# Patient Record
Sex: Male | Born: 1975 | Race: White | Hispanic: No | Marital: Married | Smoking: Former smoker
Health system: Southern US, Community
[De-identification: ages and names within clinical notes are randomized; demographics above are authoritative.]

## PROBLEM LIST (undated history)

## (undated) DIAGNOSIS — M1990 Unspecified osteoarthritis, unspecified site: Secondary | ICD-10-CM

## (undated) DIAGNOSIS — I499 Cardiac arrhythmia, unspecified: Secondary | ICD-10-CM

## (undated) DIAGNOSIS — I2510 Atherosclerotic heart disease of native coronary artery without angina pectoris: Secondary | ICD-10-CM

## (undated) DIAGNOSIS — R51 Headache: Secondary | ICD-10-CM

## (undated) DIAGNOSIS — E785 Hyperlipidemia, unspecified: Secondary | ICD-10-CM

## (undated) DIAGNOSIS — I10 Essential (primary) hypertension: Secondary | ICD-10-CM

## (undated) DIAGNOSIS — R519 Headache, unspecified: Secondary | ICD-10-CM

## (undated) DIAGNOSIS — F419 Anxiety disorder, unspecified: Secondary | ICD-10-CM

## (undated) DIAGNOSIS — F32A Depression, unspecified: Secondary | ICD-10-CM

## (undated) HISTORY — PX: JOINT REPLACEMENT: SHX530

## (undated) HISTORY — PX: MOUTH SURGERY: SHX715

---

## 2005-04-22 ENCOUNTER — Emergency Department: Payer: Self-pay | Admitting: Emergency Medicine

## 2005-04-22 ENCOUNTER — Other Ambulatory Visit: Payer: Self-pay

## 2005-12-07 ENCOUNTER — Emergency Department: Payer: Self-pay | Admitting: Unknown Physician Specialty

## 2005-12-07 ENCOUNTER — Other Ambulatory Visit: Payer: Self-pay

## 2010-02-17 ENCOUNTER — Emergency Department: Payer: Self-pay | Admitting: Emergency Medicine

## 2015-11-19 ENCOUNTER — Encounter: Payer: Self-pay | Admitting: Physician Assistant

## 2015-11-19 ENCOUNTER — Ambulatory Visit: Payer: Self-pay | Admitting: Physician Assistant

## 2015-11-19 VITALS — BP 140/90 | HR 93 | Temp 97.8°F

## 2015-11-19 DIAGNOSIS — G44209 Tension-type headache, unspecified, not intractable: Secondary | ICD-10-CM

## 2015-11-19 DIAGNOSIS — J029 Acute pharyngitis, unspecified: Secondary | ICD-10-CM

## 2015-11-19 LAB — POCT RAPID STREP A (OFFICE): Rapid Strep A Screen: NEGATIVE

## 2015-11-19 MED ORDER — CYCLOBENZAPRINE HCL 10 MG PO TABS: 10.0000 mg | ORAL_TABLET | Freq: Three times a day (TID) | ORAL | Status: DC | PRN

## 2015-11-19 NOTE — Progress Notes (Signed)
S: c/o headache since yesterday, pain at sides of head, no fever/chills, no known injury, no photophobia or sensitivity to noise, did get a little nauseated due to pain, no blurred vision, no loss of speech, etc, no weakness, states pain is better than when he made appointment  O: vitals wnl, bp at 140/90; normocephalic, perrl eomi, tms clear, throat red, neck supple no lymph, lungs c t a, cv rrr, cspine nontender, trap muscles spasmed b/l, grips = b/l, cn II-XII grossly intact, q-strep neg  A: tension headache  P: flexeril otc tylenol or motrin, if headache worsening go to ER for ct scan

## 2015-11-19 NOTE — Patient Instructions (Signed)
Tension Headache A tension headache is pain, pressure, or aching that is felt over the front and sides of your head. These headaches can last from 30 minutes to several days. HOME CARE Managing Pain  Take over-the-counter and prescription medicines only as told by your doctor.  Lie down in a dark, quiet room when you have a headache.  If directed, apply ice to your head and neck area:  Put ice in a plastic bag.  Place a towel between your skin and the bag.  Leave the ice on for 20 minutes, 2-3 times per day.  Use a heating pad or a hot shower to apply heat to your head and neck area as told by your doctor. Eating and Drinking  Eat meals on a regular schedule.  Do not drink a lot of alcohol.  Do not use a lot of caffeine, or stop using caffeine. General Instructions  Keep all follow-up visits as told by your doctor. This is important.  Keep a journal to find out if certain things bring on headaches. For example, write down:  What you eat and drink.  How much sleep you get.  Any change to your diet or medicines.  Try getting a massage, or doing other things that help you to relax.  Lessen stress.  Sit up straight. Do not tighten (tense) your muscles.  Do not use tobacco products. This includes cigarettes, chewing tobacco, or e-cigarettes. If you need help quitting, ask your doctor.  Exercise regularly as told by your doctor.  Get enough sleep. This may mean 7-9 hours of sleep. GET HELP IF:  Your symptoms are not helped by medicine.  You have a headache that feels different from your usual headache.  You feel sick to your stomach (nauseous) or you throw up (vomit).  You have a fever. GET HELP RIGHT AWAY IF:  Your headache becomes very bad.  You keep throwing up.  You have a stiff neck.  You have trouble seeing.  You have trouble speaking.  You have pain in your eye or ear.  Your muscles are weak or you lose muscle control.  You lose your balance  or you have trouble walking.  You feel like you will pass out (faint) or you pass out.  You have confusion.   This information is not intended to replace advice given to you by your health care provider. Make sure you discuss any questions you have with your health care provider.   Document Released: 02/07/2010 Document Revised: 08/04/2015 Document Reviewed: 03/08/2015 Elsevier Interactive Patient Education 2016 [employer] General Headache Without Cause A headache is pain or discomfort felt around the head or neck area. There are many causes and types of headaches. In some cases, the cause may not be found.  HOME CARE  Managing Pain  Take over-the-counter and prescription medicines only as told by your doctor.  Lie down in a dark, quiet room when you have a headache.  If directed, apply ice to the head and neck area:  Put ice in a plastic bag.  Place a towel between your skin and the bag.  Leave the ice on for 20 minutes, 2-3 times per day.  Use a heating pad or hot shower to apply heat to the head and neck area as told by your doctor.  Keep lights dim if bright lights bother you or make your headaches worse. Eating and Drinking  Eat meals on a regular schedule.  Lessen how much alcohol you drink.  Lessen how much caffeine you drink, or stop drinking caffeine. General Instructions  Keep all follow-up visits as told by your doctor. This is important.  Keep a journal to find out if certain things bring on headaches. For example, write down:  What you eat and drink.  How much sleep you get.  Any change to your diet or medicines.  Relax by getting a massage or doing other relaxing activities.  Lessen stress.  Sit up straight. Do not tighten (tense) your muscles.  Do not use tobacco products. This includes cigarettes, chewing tobacco, or e-cigarettes. If you need help quitting, ask your doctor.  Exercise regularly as told by your doctor.  Get enough sleep.  This often means 7-9 hours of sleep. GET HELP IF:  Your symptoms are not helped by medicine.  You have a headache that feels different than the other headaches.  You feel sick to your stomach (nauseous) or you throw up (vomit).  You have a fever. GET HELP RIGHT AWAY IF:   Your headache becomes really bad.  You keep throwing up.  You have a stiff neck.  You have trouble seeing.  You have trouble speaking.  You have pain in the eye or ear.  Your muscles are weak or you lose muscle control.  You lose your balance or have trouble walking.  You feel like you will pass out (faint) or you pass out.  You have confusion.   This information is not intended to replace advice given to you by your health care provider. Make sure you discuss any questions you have with your health care provider.   Document Released: 08/22/2008 Document Revised: 08/04/2015 Document Reviewed: 03/08/2015 Elsevier Interactive Patient Education [employer]

## 2015-11-30 ENCOUNTER — Ambulatory Visit: Payer: Self-pay | Admitting: Physician Assistant

## 2015-12-06 ENCOUNTER — Encounter: Payer: Self-pay | Admitting: Physician Assistant

## 2015-12-06 ENCOUNTER — Ambulatory Visit: Payer: Self-pay | Admitting: Physician Assistant

## 2015-12-06 VITALS — BP 120/70 | HR 114 | Temp 98.5°F

## 2015-12-06 DIAGNOSIS — R519 Headache, unspecified: Secondary | ICD-10-CM

## 2015-12-06 DIAGNOSIS — R51 Headache: Principal | ICD-10-CM

## 2015-12-06 MED ORDER — BUTALBITAL-APAP-CAFFEINE 50-300-40 MG PO CAPS: 1.0000 | ORAL_CAPSULE | Freq: Four times a day (QID) | ORAL | Status: DC | PRN

## 2015-12-06 NOTE — Progress Notes (Signed)
S: continued headaches, concerned as had a really bad headache and [place], had ct scan which was neg, was given fluids and steroid through iv, headache went away, states can't really associate anything with it other than it tends to happen more in the afternoons, sometimes after eating, no hx food allergies, no v from headache, no aura, never had headaches like this before  O: vitals wnl, nad, perrl eomi, cn II-XII grossly intact, lungs c t a, cv rrr  A: migraine  P: fioricet, mri brain with and without contrast, refer to neurology

## 2015-12-07 ENCOUNTER — Emergency Department: Payer: 59

## 2015-12-07 ENCOUNTER — Emergency Department
Admission: EM | Admit: 2015-12-07 | Discharge: 2015-12-07 | Disposition: A | Discharge status: Home / Self Care | Payer: 59 | Attending: Emergency Medicine | Admitting: Emergency Medicine

## 2015-12-07 DIAGNOSIS — I10 Essential (primary) hypertension: Secondary | ICD-10-CM | POA: Diagnosis not present

## 2015-12-07 DIAGNOSIS — R51 Headache: Secondary | ICD-10-CM | POA: Insufficient documentation

## 2015-12-07 DIAGNOSIS — R519 Headache, unspecified: Secondary | ICD-10-CM

## 2015-12-07 DIAGNOSIS — Z87891 Personal history of nicotine dependence: Secondary | ICD-10-CM | POA: Diagnosis not present

## 2015-12-07 HISTORY — DX: Essential (primary) hypertension: I10

## 2015-12-07 LAB — BASIC METABOLIC PANEL
Anion gap: 13 (ref 5–15)
BUN: 12 mg/dL (ref 6–20)
CHLORIDE: 101 mmol/L (ref 101–111)
CO2: 23 mmol/L (ref 22–32)
CREATININE: 0.79 mg/dL (ref 0.61–1.24)
Calcium: 10.3 mg/dL (ref 8.9–10.3)
GFR calc non Af Amer: >60 mL/min (ref >60)
GLUCOSE: 87 mg/dL (ref 65–99)
Potassium: 3.5 mmol/L (ref 3.5–5.1)
Sodium: 137 mmol/L (ref 135–145)

## 2015-12-07 LAB — CBC WITH DIFFERENTIAL/PLATELET
BASOS PCT: 1 %
Basophils Absolute: 0 10*3/uL (ref 0–0.1)
EOS ABS: 0.2 10*3/uL (ref 0–0.7)
EOS PCT: 2 %
HEMATOCRIT: 43.2 % (ref 40.0–52.0)
HEMOGLOBIN: 15 g/dL (ref 13.0–18.0)
Lymphocytes Relative: 19 %
Lymphs Abs: 2.1 10*3/uL (ref 1.0–3.6)
MCH: 32.1 pg (ref 26.0–34.0)
MCHC: 34.7 g/dL (ref 32.0–36.0)
MCV: 92.5 fL (ref 80.0–100.0)
Monocytes Absolute: 0.7 10*3/uL (ref 0.2–1.0)
Monocytes Relative: 7 %
Neutro Abs: 7.8 10*3/uL — ABNORMAL HIGH (ref 1.4–6.5)
Neutrophils Relative %: 71 %
Platelets: 298 10*3/uL (ref 150–440)
RBC: 4.67 MIL/uL (ref 4.40–5.90)
RDW: 13.2 % (ref 11.5–14.5)
WBC: 10.8 10*3/uL — AB (ref 3.8–10.6)

## 2015-12-07 MED ORDER — DIPHENHYDRAMINE HCL 50 MG/ML IJ SOLN
12.5000 mg | Freq: Once | INTRAMUSCULAR | Status: AC
  Administered 2015-12-07: 12.5 mg via INTRAVENOUS
  Filled 2015-12-07: qty 1

## 2015-12-07 MED ORDER — METOCLOPRAMIDE HCL 5 MG/ML IJ SOLN
10.0000 mg | Freq: Once | INTRAMUSCULAR | Status: AC
  Administered 2015-12-07: 10 mg via INTRAVENOUS
  Filled 2015-12-07: qty 2

## 2015-12-07 MED ORDER — SODIUM CHLORIDE 0.9 % IV BOLUS (SEPSIS)
1000.0000 mL | Freq: Once | INTRAVENOUS | Status: AC
  Administered 2015-12-07: 1000 mL via INTRAVENOUS

## 2015-12-07 NOTE — ED Notes (Signed)
Pt with recent hx of HA. Has scheduled MRI end of month. Pain was 10/10 when arrived, 6/10 now.

## 2015-12-07 NOTE — ED Provider Notes (Addendum)
[facility] Emergency Department Provider Note  ____________________________________________   I have reviewed the triage vital signs and the nursing notes.   HISTORY  Chief Complaint Headache    HPI Dennis Davidson is a 40 y.o. male with a history of headaches for the last 3-4 weeks. He has not had headaches significant only prior to that. The headaches come and go gradually. The initial one seemed to be more precipitous in onset this is a timely been gradual. He has had no carbon monoxide exposure. It happened on vacation. No family members with similar. No vomiting no photophobia and no neurologic complaint of stiff neck, no recent change in his caffeine he has tried drinking caffeine and not drink caffeine in it does not affect it. Seems to be sometimes related to food. He has had no abdominal pain shortness of breath or cough. He denies numbness or weakness or difficulty talking or seeing, he has no neurologic complaints. The patient works as a Emergency planning/management officer. He denies any head trauma or history of head traumas. He denies any personal or family history of aneurysm. He has had a negative CT scan for this. He is scheduled to see neurology. He has an outpatient MRI scheduled in a few weeks. The patient has had a history of neck pain in the past but this headache is more diffuse and more frontal. There is no aura. He denies any significant headache history in his family. He denies any change in environmental situation that might cause his headaches. He states today the headache gradually worse. He was given Fioricet for this as an outpatient when he went to the hospital before Christmas, and he took 2 of them. His headache is now diminished; it is a 5 or 6 out of 10 at this time.    Past Medical History  Diagnosis Date  . Hypertension     There are no active problems to display for this patient.   History reviewed. No pertinent past surgical history.  No current  outpatient prescriptions on file.  Allergies Review of patient's allergies indicates no known allergies.  No family history on file.  Social History Social History  Substance Use Topics  . Smoking status: Former Games developer  . Smokeless tobacco: None  . Alcohol Use: No    Review of Systems }Constitutional: No fever/chills Eyes: No visual changes. ENT: No sore throat. No stiff neck no neck pain Cardiovascular: Denies chest pain. Respiratory: Denies shortness of breath. Gastrointestinal:   no vomiting.  No diarrhea.  No constipation. Genitourinary: Negative for dysuria. Musculoskeletal: Negative lower extremity swelling Skin: Negative for rash. Neurological: Negative for headaches, focal weakness or numbness. 10-point ROS otherwise negative.  ____________________________________________   PHYSICAL EXAM:  VITAL SIGNS: ED Triage Vitals  Enc Vitals Group     BP 12/07/15 1412 174/102 mmHg     Pulse Rate 12/07/15 1412 99     Resp 12/07/15 1412 18     Temp 12/07/15 1412 97.7 F (36.5 C)     Temp Source 12/07/15 1412 Oral     SpO2 12/07/15 1412 100 %     Weight 12/07/15 1413 175 lb (79.379 kg)     Height 12/07/15 1413 5\' 8"  (1.727 m)     Head Cir --      Peak Flow --      Pain Score 12/07/15 1413 10     Pain Loc --      Pain Edu? --      Excl.  in GC? --     Constitutional: Alert and oriented. Well appearing and in no acute distress. Eyes: Conjunctivae are normal. PERRL. EOMI. Head: Atraumatic. Nose: No congestion/rhinnorhea. Mouth/Throat: Mucous membranes are moist.  Oropharynx non-erythematous. Neck: No stridor.   Nontender with no meningismus Cardiovascular: Normal rate, regular rhythm. Grossly normal heart sounds.  Good peripheral circulation. Respiratory: Normal respiratory effort.  No retractions. Lungs CTAB. Abdominal: Soft and nontender. No distention. No guarding no rebound Back:  There is no focal tenderness or step off there is no midline tenderness there are  no lesions noted. there is no CVA tenderness Musculoskeletal: No lower extremity tenderness. No joint effusions, no DVT signs strong distal pulses no edema Cranial nerves II through XII are grossly intact 5 out of 5 strength bilateral upper and lower extremity. Finger to nose within normal limits heel to shin within normal limits, speech is normal with no word finding difficulty or dysarthria, reflexes symmetric, pupils are equally round and reactive to light, there is no pronator drift, sensation is normal, vision is intact to confrontation, gait is deferred, there is no nystagmus, normal neurologic exam Skin:  Skin is warm, dry and intact. No rash noted. Psychiatric: Mood and affect are normal. Speech and behavior are normal.  ____________________________________________   LABS (all labs ordered are listed, but only abnormal results are displayed)  Labs Reviewed  CBC WITH DIFFERENTIAL/PLATELET  BASIC METABOLIC PANEL   ____________________________________________  EKG  I personally interpreted any EKGs ordered by me or triage  ____________________________________________  RADIOLOGY  I reviewed any imaging ordered by me or triage that were performed during my shift ____________________________________________   PROCEDURES  Procedure(s) performed: None  Critical Care performed: None  ____________________________________________   INITIAL IMPRESSION / ASSESSMENT AND PLAN / ED COURSE  Pertinent labs & imaging results that were available during my care of the patient were reviewed by me and considered in my medical decision making (see chart for details).  Patient with 8 or 10 recurrent headaches over the last several weeks. Possibly cluster headaches unclear etiology. We will obtain an MRI if we can today to rule out structural abnormality. I do not think this represents meningitis obviously given the time course and lack of fever. Very low suspicion for aneurysmal event given  the fact that he has had 10 of these headaches over the last 3 or 4 weeks with a completely normal neurologic exam and a prior negative CT reported. We will check basic blood work and reassess. Blood pressure somewhat elevated here however patient is in pain and we will recheck that as well.   ----------------------------------------- 6:29 PM on 12/07/2015 -----------------------------------------  MRI is negative, blood pressure as the patient because less anxious and is rapidly trending down as expected. No evidence of that this is a hypertensive urgency or emergency. Patient has ongoing repeatedly normal neurologic exams. I did discuss the limitations of emergency room workup without an LP and offered LP the patient declines. I do not think this is unreasonable but he does understand that this limits but I can evaluate him for including bleed. Obviously I have low suspicion. There is no evidence of meningitis or mass at this time. Certainly other causes of headache are possible including cluster headaches. We will discuss with neurology after repeat his blood pressure.  ----------------------------------------- 6:52 PM on 12/07/2015 -----------------------------------------  Blood pressure is still mildly high patient states that it always is high when he is around doctors, he has however gone several times to the clinic  during the course of these headaches and had normal blood pressure. I do not think therefore that this likely represents any acute hypertensive headache and patient does not wish any blood pressure medication which I do not think is unreasonable. He again declines lumbar puncture. I was able to talk to Dr. Thana FarrLeslie Reynolds, who agrees with management and agrees with discharge and will help him outpatient follow-up. She took his number and will try to call him tomorrow to see if she can find someone to see him. In the meantime, extensive return precautions and follow-up have been given  to the patient, he is laughing and joking in the room in no distress continues to decline lumbar puncture and is eager to go home. Extensive return precautions have been given however he understands that he can come back if he changes his mind or he feels worse. ____________________________________________   FINAL CLINICAL IMPRESSION(S) / ED DIAGNOSES  Final diagnoses:  None     Jeanmarie PlantJames A Aleister Lady, MD 12/07/15 1657  Jeanmarie PlantJames A Charlesetta Milliron, MD 12/07/15 1830  Jeanmarie PlantJames A Emerald Gehres, MD 12/07/15 [phone]

## 2015-12-07 NOTE — Discharge Instructions (Signed)
You have declined a lumbar puncture today, this is certainly your choice and not unreasonable but if you feel worse in any way including increased pain, numbness, weakness difficulty talking worsening headache or you feel otherwise unwell please return to the emergency room and we will be happy to reassess you. Otherwise, please follow closely with your primary care doctor for your mildly elevated blood pressure in the next few days as well as with neurology.

## 2015-12-07 NOTE — ED Notes (Signed)
Pt states he began having HA 2 hr PTA with nausea.. States he took 2 fioricet without any relief.. [facility] and was dx with migraine, states since he was seen by Darl PikesSusan at the employee clinic and has an apt for MRI and other test..

## 2015-12-08 ENCOUNTER — Encounter: Payer: Self-pay | Admitting: Physician Assistant

## 2015-12-08 ENCOUNTER — Ambulatory Visit (INDEPENDENT_AMBULATORY_CARE_PROVIDER_SITE_OTHER): Payer: 59 | Admitting: Neurology

## 2015-12-08 ENCOUNTER — Encounter: Payer: Self-pay | Admitting: Neurology

## 2015-12-08 VITALS — BP 124/82 | HR 89 | Ht 68.0 in | Wt 160.0 lb

## 2015-12-08 DIAGNOSIS — G43909 Migraine, unspecified, not intractable, without status migrainosus: Secondary | ICD-10-CM

## 2015-12-08 DIAGNOSIS — G4486 Cervicogenic headache: Secondary | ICD-10-CM

## 2015-12-08 DIAGNOSIS — R292 Abnormal reflex: Secondary | ICD-10-CM | POA: Diagnosis not present

## 2015-12-08 DIAGNOSIS — Z5181 Encounter for therapeutic drug level monitoring: Secondary | ICD-10-CM | POA: Diagnosis not present

## 2015-12-08 DIAGNOSIS — M542 Cervicalgia: Secondary | ICD-10-CM

## 2015-12-08 DIAGNOSIS — R51 Headache: Secondary | ICD-10-CM | POA: Diagnosis not present

## 2015-12-08 MED ORDER — METHYLPREDNISOLONE 4 MG PO TBPK
ORAL_TABLET | ORAL | Status: DC
Start: 2015-12-08 — End: 2016-04-12

## 2015-12-08 MED ORDER — SUMATRIPTAN SUCCINATE 100 MG PO TABS: 100.0000 mg | ORAL_TABLET | Freq: Once | ORAL | Status: DC | PRN

## 2015-12-08 NOTE — Progress Notes (Signed)
Dennis Davidson was seen today in neurologic consultation at the request of Dr. Thad Ranger.  He was seen as a same day work in following an ED visit.  This patient is accompanied in the office by his girlfriend who supplements the history.   The consultation is for the evaluation of headache.   The patient is a 40 y.o. year old male with a history of headache since December 23 (may have been going on a day or two before this).  He did visit his PCP that day and was started on Flexeril.  States that his headache that day was diffuse, throbbing.  He was nauseated but no emesis.  No visual field changes.  No autonomic features.  He thought that his BP would be elevated but it was only borderline when he got to the office (he states that this is his "normal.").  It was thought that it was tension and he took the flexeril for a few days and he thought that it was helping because "it knocked me out."  He went to the beach for [place] and noted that he continued to have headache.  He took excedrin and flexeril.  The headache never went away but was a dull ache.  On 12/30, he states that he was coming out of a movie and the headache was more intense and he felt "sick all over."  He had appetite loss.  On New years eve, he had a severe jackhammer like pain in the front of his head and it was frontal in nature and he had some paresthesias in the arms.  He was coherent but didn't feel cognitively right.  He was taken via EMS to the hospital.  He states that his BP was low at 90/50.  He had a normal CT and was given IV steroids and fioricet.  He states that he felt great after the steroid for 4 days (almost hyper).  He states that slowly the generalized dull ache began to creep back.  He f/u with PCP on 12/06/15 per records and was given fioricet (renewed RX from ER) - given about 20 pills in ED.  He then ended up in the ER yesterday.   He states that it started in the late morning (seems to be the pattern) and he got a BC  powder.  It helped minimally.  He then stopped at his house and took 2 excedrin tension.  That didn't help and he took fioricet.  He states that it was generalized and felt like a slow build up of pressure.  About every 15 min he would get a stronger pressure that was more debilitating.  He went to the ER and by the time he got back to the back of the ED, he was feeling some better.  He does feel like the headaches are making him cognitively dull.  He doesn't think that photophobia has been an issue but perhaps phonophobia (which has always bothered him).  Generally, he has been taking something every other day for headache (abortive).  No previous hx of headache.  His head is not sore to the touch.  Does admit that in 2002 he was being seen by Dr. Wynetta Emery and was getting ready to have neck surgery for paresthesias, neck pain and loss of strength but sx's resolved and he did not end up having sx.  Does have some neck pain now.  Bladder/bowel under good control.    Neuroimaging has  previously been performed.  It  is not available for my review today.  I attempted to pull images via Epic and via canopy and unable to retrieve images.  The patient had an MRI of the brain without contrast on 05/06/2016.  This was normal.  PREVIOUS MEDICATIONS: n/a  ALLERGIES:  No Known Allergies  CURRENT MEDICATIONS:  Current Outpatient Prescriptions on File Prior to Visit  Medication Sig Dispense Refill  . Butalbital-APAP-Caffeine 50-300-40 MG CAPS Take 1 tablet by mouth 4 (four) times daily as needed. 20 capsule 0  . gemfibrozil (LOPID) 600 MG tablet Take 600 mg by mouth daily.   0  . lisinopril-hydrochlorothiazide (PRINZIDE,ZESTORETIC) 10-12.5 MG tablet Take 1 tablet by mouth daily.   0   No current facility-administered medications on file prior to visit.    PAST MEDICAL HISTORY:   Past Medical History  Diagnosis Date  . Hypertension     PAST SURGICAL HISTORY:   Past Surgical History  Procedure Laterality Date    . Mouth surgery      SOCIAL HISTORY:   Social History   Social History  . Marital Status: Single    Spouse Name: N/A  . Number of Children: N/A  . Years of Education: N/A   Occupational History  . Not on file.   Social History Main Topics  . Smoking status: Former Smoker    Quit date: 12/08/2007  . Smokeless tobacco: Not on file  . Alcohol Use: 0.0 oz/week    0 Standard drinks or equivalent per week     Comment: 1-2 month  . Drug Use: No  . Sexual Activity: Not on file   Other Topics Concern  . Not on file   Social History Narrative   ** Merged History Encounter **        FAMILY HISTORY:   Family Status  Relation Status Death Age  . Mother Alive     DM, HTN  . Father Alive     HTN    ROS:  A complete 10 system review of systems was obtained and was unremarkable apart from what is mentioned above.  PHYSICAL EXAMINATION:    VITALS:   Filed Vitals:   12/08/15 1127  BP: 124/82  Pulse: 89  Height: 5\' 8"  (1.727 m)  Weight: 160 lb (72.576 kg)    GEN:  Appears stated age and in NAD.  Non-toxic.  Laughing/joking in examination room HEENT:  Normocephalic, atraumatic. The mucous membranes are moist. The superficial temporal arteries are without ropiness or tenderness. Cardiovascular: Regular rate and rhythm. Lungs: Clear to auscultation bilaterally. Neck: There are no carotid bruits noted bilaterally.  NEUROLOGICAL: Orientation:  Pt is alert and oriented x 3.  Fund of knowledge is appropriate.  Recent and remote memory intact.  Attention and concentration normal.  Able to repeat and name. Cranial nerves: There is good facial symmetry. The pupils are equal round and reactive to light bilaterally. Funduscopic exam reveals clear disc margins bilaterally. Extraocular muscles are intact and visual fields are full to confrontational testing. Speech is fluent and clear. Soft palate rises symmetrically and there is no tongue deviation. Hearing is intact to conversational  tone. Tone: Tone is good throughout. Sensation: Sensation is intact to light touch and pinprick throughout (facial, extremities, trunk). Vibration is intact at the bilateral big toe. There is no extinction with double simultaneous stimulation. There is no sensory dermatomal level identified. Coordination:  The patient has no difficulty with RAM's or FNF bilaterally. Motor: Strength is 5/5 in the bilateral upper and lower  extremities. Shoulder shrug is equal and symmetric.  There is no pronator drift.  There are no fasciculations noted. DTR's: Deep tendon reflexes are 2/4 at the bilateral biceps, triceps, brachioradialis, 3+ at the bilateral patella with cross adductor reflexes and 2/4 at the bilateral achilles.  No ankle clonus Plantar responses are downgoing bilaterally. Gait and Station: The patient is able to ambulate without difficulty. The patient is able to heel toe walk without any difficulty. The patient is able to ambulate in a tandem fashion. The patient is able to stand in the Romberg position.   IMPRESSIONS/PLAN:  1.  Headache.    -neurologic examination is nonfocal and nonlateralizing.  -I did talk to the patient about the fact that this could potentially represent cervicogenic headache.  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  He does have some degree of neck pain and we will go ahead and do an MRI of the cervical spine.  -Talked about rebound headache.  Toe to avoid analgesics more than 2 or 3 days a week.  -Going to give him a short course of Medrol.  We discussed extensively the r/b/se of steroid medication and I gave them the opportunity to ask questions and I answered them to the best of my ability.  -try imitrex for headache.  Doesn't want injections so will try pills.  Take one at the onset of headache, may repeat in 2 hours.  No more than 2 per  day or more than 2 or 3 days per week.  Risks, benefits, side effects and alternative therapies were discussed.  The opportunity to ask questions was given and they were answered to the best of my ability.  The patient expressed understanding and willingness to follow the outlined treatment protocols.  -f/u based on above (whether headache resolves or not and MRI findings)

## 2015-12-10 ENCOUNTER — Other Ambulatory Visit: Payer: Self-pay

## 2015-12-10 VITALS — BP 120/80 | HR 97 | Temp 98.6°F

## 2015-12-10 DIAGNOSIS — Z299 Encounter for prophylactic measures, unspecified: Secondary | ICD-10-CM

## 2015-12-10 NOTE — Progress Notes (Signed)
Patient came in to have blood drawn per Dr. Arbutus Leasat at [facility].  Blood was drawn from right arm without any incident.

## 2015-12-11 LAB — CMP12+LP+TP+TSH+6AC+PSA+CBC…
A/G RATIO: 2.2 (ref 1.1–2.5)
ALBUMIN: 4.7 g/dL (ref 3.5–5.5)
ALK PHOS: 57 IU/L (ref 39–117)
ALT: 21 IU/L (ref 0–44)
AST: 17 IU/L (ref 0–40)
BASOS: 0 %
BUN/Creatinine Ratio: 14 (ref 8–19)
BUN: 12 mg/dL (ref 6–20)
Basophils Absolute: 0 10*3/uL (ref 0.0–0.2)
CHOL/HDL RATIO: 4.4 ratio (ref 0.0–5.0)
Calcium: 9.5 mg/dL (ref 8.7–10.2)
Chloride: 100 mmol/L (ref 96–106)
Cholesterol, Total: 266 mg/dL — ABNORMAL HIGH (ref 100–199)
Creatinine, Ser: 0.85 mg/dL (ref 0.76–1.27)
EOS (ABSOLUTE): 0.1 10*3/uL (ref 0.0–0.4)
ESTIMATED CHD RISK: 0.9 times avg. (ref 0.0–1.0)
Eos: 1 %
Free Thyroxine Index: 1.7 (ref 1.2–4.9)
GFR calc non Af Amer: 111 mL/min/{1.73_m2} (ref >59)
GFR, EST AFRICAN AMERICAN: 128 mL/min/{1.73_m2} (ref >59)
GGT: 109 IU/L — AB (ref 0–65)
GLOBULIN, TOTAL: 2.1 g/dL (ref 1.5–4.5)
Glucose: 91 mg/dL (ref 65–99)
HDL: 61 mg/dL (ref >39)
HEMATOCRIT: 40.3 % (ref 37.5–51.0)
HEMOGLOBIN: 14.1 g/dL (ref 12.6–17.7)
IMMATURE GRANS (ABS): 0 10*3/uL (ref 0.0–0.1)
IMMATURE GRANULOCYTES: 0 %
IRON: 73 ug/dL (ref 38–169)
LDH: 139 IU/L (ref 121–224)
LDL Calculated: 174 mg/dL — ABNORMAL HIGH (ref 0–99)
LYMPHS: 22 %
Lymphocytes Absolute: 2.4 10*3/uL (ref 0.7–3.1)
MCH: 33 pg (ref 26.6–33.0)
MCHC: 35 g/dL (ref 31.5–35.7)
MCV: 94 fL (ref 79–97)
MONOCYTES: 5 %
MONOS ABS: 0.6 10*3/uL (ref 0.1–0.9)
NEUTROS PCT: 72 %
Neutrophils Absolute: 7.7 10*3/uL — ABNORMAL HIGH (ref 1.4–7.0)
PROSTATE SPECIFIC AG, SERUM: 1.4 ng/mL (ref 0.0–4.0)
Phosphorus: 3.6 mg/dL (ref 2.5–4.5)
Platelets: 242 10*3/uL (ref 150–379)
Potassium: 4.2 mmol/L (ref 3.5–5.2)
RBC: 4.27 x10E6/uL (ref 4.14–5.80)
RDW: 13.5 % (ref 12.3–15.4)
SODIUM: 140 mmol/L (ref 134–144)
T3 Uptake Ratio: 30 % (ref 24–39)
T4, Total: 5.5 ug/dL (ref 4.5–12.0)
TSH: 3.46 u[IU]/mL (ref 0.450–4.500)
Total Protein: 6.8 g/dL (ref 6.0–8.5)
Triglycerides: 157 mg/dL — ABNORMAL HIGH (ref 0–149)
Uric Acid: 7.6 mg/dL (ref 3.7–8.6)
VLDL Cholesterol Cal: 31 mg/dL (ref 5–40)
WBC: 10.8 10*3/uL (ref 3.4–10.8)

## 2015-12-13 ENCOUNTER — Encounter: Payer: Self-pay | Admitting: Neurology

## 2015-12-13 MED ORDER — TRAMADOL HCL 50 MG PO TABS: 50.0000 mg | ORAL_TABLET | Freq: Four times a day (QID) | ORAL | Status: DC | PRN

## 2015-12-13 MED ORDER — TIZANIDINE HCL 4 MG PO TABS: ORAL_TABLET | ORAL | Status: DC

## 2015-12-13 NOTE — Telephone Encounter (Signed)
zanaflex tablets - 4 mg - 1/2 po q hs x 3 nights, then 1 po q hs  Ultram - 50 mg - 1 po prn headache #20 RF 0   Sent to pharmacy per Dr Tat. Patient made aware.

## 2015-12-15 ENCOUNTER — Other Ambulatory Visit: Payer: Self-pay | Admitting: Emergency Medicine

## 2015-12-15 MED ORDER — LISINOPRIL-HYDROCHLOROTHIAZIDE 10-12.5 MG PO TABS: 1.0000 | ORAL_TABLET | Freq: Every day | ORAL | Status: DC

## 2015-12-15 NOTE — Telephone Encounter (Signed)
Med refill approved, pt had recent labs, kidney functions normal, bp ok when in clinic

## 2015-12-15 NOTE — Telephone Encounter (Signed)
Received a faxed medication request from [facility] in [place].  Please advise.  Thank you. 

## 2015-12-16 ENCOUNTER — Ambulatory Visit
Admission: RE | Admit: 2015-12-16 | Discharge: 2015-12-16 | Disposition: A | Discharge status: Home / Self Care | Payer: Managed Care, Other (non HMO) | Source: Ambulatory Visit | Attending: Neurology | Admitting: Neurology

## 2015-12-16 ENCOUNTER — Ambulatory Visit: Payer: Managed Care, Other (non HMO)

## 2015-12-16 DIAGNOSIS — M50221 Other cervical disc displacement at C4-C5 level: Secondary | ICD-10-CM | POA: Insufficient documentation

## 2015-12-16 DIAGNOSIS — R51 Headache: Secondary | ICD-10-CM | POA: Insufficient documentation

## 2015-12-16 DIAGNOSIS — M79601 Pain in right arm: Secondary | ICD-10-CM | POA: Insufficient documentation

## 2015-12-16 DIAGNOSIS — M542 Cervicalgia: Secondary | ICD-10-CM | POA: Diagnosis not present

## 2015-12-16 DIAGNOSIS — R292 Abnormal reflex: Secondary | ICD-10-CM | POA: Insufficient documentation

## 2015-12-16 DIAGNOSIS — M50321 Other cervical disc degeneration at C4-C5 level: Secondary | ICD-10-CM | POA: Insufficient documentation

## 2015-12-16 DIAGNOSIS — M79602 Pain in left arm: Secondary | ICD-10-CM | POA: Diagnosis not present

## 2015-12-16 DIAGNOSIS — R200 Anesthesia of skin: Secondary | ICD-10-CM | POA: Insufficient documentation

## 2015-12-16 DIAGNOSIS — G4486 Cervicogenic headache: Secondary | ICD-10-CM

## 2015-12-17 ENCOUNTER — Telehealth: Payer: Self-pay | Admitting: Neurology

## 2015-12-17 NOTE — Telephone Encounter (Signed)
Left message on machine for patient to call back.

## 2015-12-17 NOTE — Telephone Encounter (Signed)
-----   Message from Octaviano Batty Tat, DO sent at 12/16/2015  3:24 PM EST ----- Reviewed and agree.  Genine Beckett please let pt know that there is disc protrusion at C5-6 that causes flattening of [place] and would recommend see surgeon again.  I know he saw one years ago and can see same one.  Send referral.

## 2015-12-17 NOTE — Telephone Encounter (Signed)
Patient made aware of results and would like referral sent to his old neurosurgeon Dr Wynetta Emery. Referral faxed to [facility] at [phone] with confirmation received. They will contact the patient to schedule.

## 2015-12-23 ENCOUNTER — Ambulatory Visit: Payer: Self-pay

## 2016-01-16 ENCOUNTER — Other Ambulatory Visit: Payer: Self-pay | Admitting: Physician Assistant

## 2016-01-17 NOTE — Telephone Encounter (Signed)
Med refill approved, labs were drawn in last 6 months

## 2016-04-12 ENCOUNTER — Ambulatory Visit: Payer: Self-pay | Admitting: Physician Assistant

## 2016-04-12 ENCOUNTER — Encounter: Payer: Self-pay | Admitting: Physician Assistant

## 2016-04-12 VITALS — BP 160/100 | HR 111 | Temp 98.1°F

## 2016-04-12 DIAGNOSIS — J3089 Other allergic rhinitis: Secondary | ICD-10-CM

## 2016-04-12 MED ORDER — FLUTICASONE PROPIONATE 50 MCG/ACT NA SUSP: 2.0000 | Freq: Every day | NASAL | Status: DC

## 2016-04-12 NOTE — Progress Notes (Signed)
S: c/o runny nose, congestion, some sinus pressure, sx for about a week, denies fever/chills/body aches, cough, cp/sob, or v/d  O: vitals wnl, nad, perrl eomi, conjunctiva wnl, tms dull, nasal mucosa swollen and boggy, throat wnl, neck supple no lymph, lungs c t a, cv rrr  A: acute seasonal allergies  P: saline nasal rinse, flonase, otc allegra

## 2016-06-23 ENCOUNTER — Other Ambulatory Visit: Payer: Self-pay | Admitting: Physician Assistant

## 2016-08-28 ENCOUNTER — Encounter: Payer: Self-pay | Admitting: Physician Assistant

## 2016-08-28 ENCOUNTER — Ambulatory Visit: Payer: Self-pay | Admitting: Physician Assistant

## 2016-08-28 VITALS — BP 167/97 | HR 127 | Temp 98.3°F

## 2016-08-28 DIAGNOSIS — R000 Tachycardia, unspecified: Secondary | ICD-10-CM

## 2016-08-28 DIAGNOSIS — F411 Generalized anxiety disorder: Secondary | ICD-10-CM

## 2016-08-28 MED ORDER — TRAZODONE HCL 150 MG PO TABS: 150.0000 mg | ORAL_TABLET | Freq: Every evening | ORAL | 3 refills | Status: DC | PRN

## 2016-08-28 MED ORDER — METOPROLOL SUCCINATE ER 25 MG PO TB24: 25.0000 mg | ORAL_TABLET | Freq: Every day | ORAL | 3 refills | Status: DC

## 2016-08-28 NOTE — Patient Instructions (Addendum)
Nonspecific Tachycardia Tachycardia is a faster than normal heartbeat (more than 100 beats per minute). In adults, the heart normally beats between 60 and 100 times a minute. A fast heartbeat may be a normal response to exercise or stress. It does not necessarily mean that something is wrong. However, sometimes when your heart beats too fast it may not be able to pump enough blood to the rest of your body. This can result in chest pain, shortness of breath, dizziness, and even fainting. Nonspecific tachycardia means that the specific cause or pattern of your tachycardia is unknown. CAUSES  Tachycardia may be harmless or it may be due to a more serious underlying cause. Possible causes of tachycardia include:  Exercise or exertion.  Fever.  Pain or injury.  Infection.  Loss of body fluids (dehydration).  Overactive thyroid.  Lack of red blood cells (anemia).  Anxiety and stress.  Alcohol.  Caffeine.  Tobacco products.  Diet pills.  Illegal drugs.  Heart disease. SYMPTOMS  Rapid or irregular heartbeat (palpitations).  Suddenly feeling your heart beating (cardiac awareness).  Dizziness.  Tiredness (fatigue).  Shortness of breath.  Chest pain.  Nausea.  Fainting. DIAGNOSIS  Your caregiver will perform a physical exam and take your medical history. In some cases, a heart specialist (cardiologist) may be consulted. Your caregiver may also order:  Blood tests.  Electrocardiography. This test records the electrical activity of your heart.  A heart monitoring test. TREATMENT  Treatment will depend on the likely cause of your tachycardia. The goal is to treat the underlying cause of your tachycardia. Treatment methods may include:  Replacement of fluids or blood through an intravenous (IV) tube for moderate to severe dehydration or anemia.  New medicines or changes in your current medicines.  Diet and lifestyle changes.  Treatment for certain  infections.  Stress relief or relaxation methods. HOME CARE INSTRUCTIONS   Rest.  Drink enough fluids to keep your urine clear or pale yellow.  Do not smoke.  Avoid:  Caffeine.  Tobacco.  Alcohol.  Chocolate.  Stimulants such as over-the-counter diet pills or pills that help you stay awake.  Situations that cause anxiety or stress.  Illegal drugs such as marijuana, phencyclidine (PCP), and cocaine.  Only take medicine as directed by your caregiver.  Keep all follow-up appointments as directed by your caregiver. SEEK IMMEDIATE MEDICAL CARE IF:   You have pain in your chest, upper arms, jaw, or neck.  You become weak, dizzy, or feel faint.  You have palpitations that will not go away.  You vomit, have diarrhea, or pass blood in your stool.  Your skin is cool, pale, and wet.  You have a fever that will not go away with rest, fluids, and medicine. MAKE SURE YOU:   Understand these instructions.  Will watch your condition.  Will get help right away if you are not doing well or get worse.   This information is not intended to replace advice given to you by your health care provider. Make sure you discuss any questions you have with your health care provider.   Document Released: 12/21/2004 Document Revised: 02/05/2012 Document Reviewed: 05/28/2015 Elsevier Interactive Patient Education 2016 Elsevier Inc. Generalized Anxiety Disorder Generalized anxiety disorder (GAD) is a mental disorder. It interferes with life functions, including relationships, work, and school. GAD is different from normal anxiety, which everyone experiences at some point in their lives in response to specific life events and activities. Normal anxiety actually helps us prepare for and get  through these life events and activities. Normal anxiety goes away after the event or activity is over.  GAD causes anxiety that is not necessarily related to specific events or activities. It also causes  excess anxiety in proportion to specific events or activities. The anxiety associated with GAD is also difficult to control. GAD can vary from mild to severe. People with severe GAD can have intense waves of anxiety with physical symptoms (panic attacks).  SYMPTOMS The anxiety and worry associated with GAD are difficult to control. This anxiety and worry are related to many life events and activities and also occur more days than not for 6 months or longer. People with GAD also have three or more of the following symptoms (one or more in children):  Restlessness.   Fatigue.  Difficulty concentrating.   Irritability.  Muscle tension.  Difficulty sleeping or unsatisfying sleep. DIAGNOSIS GAD is diagnosed through an assessment by your health care provider. Your health care provider will ask you questions aboutyour mood,physical symptoms, and events in your life. Your health care provider may ask you about your medical history and use of alcohol or drugs, including prescription medicines. Your health care provider may also do a physical exam and blood tests. Certain medical conditions and the use of certain substances can cause symptoms similar to those associated with GAD. Your health care provider may refer you to a mental health specialist for further evaluation. TREATMENT The following therapies are usually used to treat GAD:   Medication. Antidepressant medication usually is prescribed for long-term daily control. Antianxiety medicines may be added in severe cases, especially when panic attacks occur.   Talk therapy (psychotherapy). Certain types of talk therapy can be helpful in treating GAD by providing support, education, and guidance. A form of talk therapy called cognitive behavioral therapy can teach you healthy ways to think about and react to daily life events and activities.  Stress managementtechniques. These include yoga, meditation, and exercise and can be very helpful when  they are practiced regularly. A mental health specialist can help determine which treatment is best for you. Some people see improvement with one therapy. However, other people require a combination of therapies.   This information is not intended to replace advice given to you by your health care provider. Make sure you discuss any questions you have with your health care provider.   Document Released: 03/10/2013 Document Revised: 12/04/2014 Document Reviewed: 03/10/2013 Elsevier Interactive Patient Education [employer]2016 Elsevier Inc.

## 2016-08-28 NOTE — Progress Notes (Signed)
S: c/o some anxiety, not sleeping well, several episodes where he felt his chest getting tight and got dizzy, no cp/sob at time, was seen at urgent care and was told not cardiac, hr was 90 on that visit, ? If just anxiety  O: vitals w elevated hr at 127, bp elevated at 167/97; lungs c ta , cv tachy with ?irregular rhythm Ekg, sinus tachycardia, ekg matches one from 2011  A: sinus tachycardia, anxiety, insomnia  P metoprolol ER 25mg , trazadone 150mg , cardiologist to see today

## 2016-09-07 ENCOUNTER — Ambulatory Visit
Admission: RE | Admit: 2016-09-07 | Discharge: 2016-09-07 | Disposition: A | Discharge status: Home / Self Care | Payer: Managed Care, Other (non HMO) | Source: Ambulatory Visit | Attending: Internal Medicine | Admitting: Internal Medicine

## 2016-09-07 ENCOUNTER — Encounter: Payer: Self-pay | Admitting: *Deleted

## 2016-09-07 ENCOUNTER — Encounter
Admission: RE | Disposition: A | Discharge status: Home / Self Care | Payer: Self-pay | Source: Ambulatory Visit | Attending: Internal Medicine

## 2016-09-07 DIAGNOSIS — I25110 Atherosclerotic heart disease of native coronary artery with unstable angina pectoris: Secondary | ICD-10-CM | POA: Diagnosis not present

## 2016-09-07 DIAGNOSIS — R079 Chest pain, unspecified: Secondary | ICD-10-CM | POA: Diagnosis present

## 2016-09-07 DIAGNOSIS — R002 Palpitations: Secondary | ICD-10-CM | POA: Insufficient documentation

## 2016-09-07 DIAGNOSIS — R000 Tachycardia, unspecified: Secondary | ICD-10-CM | POA: Diagnosis not present

## 2016-09-07 DIAGNOSIS — Z79899 Other long term (current) drug therapy: Secondary | ICD-10-CM | POA: Diagnosis not present

## 2016-09-07 HISTORY — PX: CARDIAC CATHETERIZATION: SHX172

## 2016-09-07 HISTORY — DX: Headache, unspecified: R51.9

## 2016-09-07 HISTORY — DX: Headache: R51

## 2016-09-07 LAB — CARDIAC CATHETERIZATION: CATHEFQUANT: 55 %

## 2016-09-07 SURGERY — LEFT HEART CATH AND CORONARY ANGIOGRAPHY
Anesthesia: Moderate Sedation

## 2016-09-07 SURGERY — LEFT HEART CATH AND CORONARY ANGIOGRAPHY
Anesthesia: Moderate Sedation | Laterality: Left

## 2016-09-07 MED ORDER — MIDAZOLAM HCL 2 MG/2ML IJ SOLN
INTRAMUSCULAR | Status: AC
  Filled 2016-09-07: qty 2

## 2016-09-07 MED ORDER — MIDAZOLAM HCL 2 MG/2ML IJ SOLN
INTRAMUSCULAR | Status: DC | PRN
  Administered 2016-09-07 (×2): 1 mg via INTRAVENOUS

## 2016-09-07 MED ORDER — SODIUM CHLORIDE 0.9% FLUSH: 3.0000 mL | INTRAVENOUS | Status: DC | PRN

## 2016-09-07 MED ORDER — HEPARIN (PORCINE) IN NACL 2-0.9 UNIT/ML-% IJ SOLN
INTRAMUSCULAR | Status: AC
  Filled 2016-09-07: qty 500

## 2016-09-07 MED ORDER — SODIUM CHLORIDE 0.9 % IV SOLN: 250.0000 mL | INTRAVENOUS | Status: DC | PRN

## 2016-09-07 MED ORDER — SODIUM CHLORIDE 0.9 % IV SOLN
INTRAVENOUS | Status: DC
  Administered 2016-09-07: 13:00:00 via INTRAVENOUS

## 2016-09-07 MED ORDER — ASPIRIN 81 MG PO CHEW: 81.0000 mg | CHEWABLE_TABLET | ORAL | Status: DC

## 2016-09-07 MED ORDER — SODIUM CHLORIDE 0.9% FLUSH: 3.0000 mL | Freq: Two times a day (BID) | INTRAVENOUS | Status: DC

## 2016-09-07 MED ORDER — SODIUM CHLORIDE 0.9 % WEIGHT BASED INFUSION: 1.0000 mL/kg/h | INTRAVENOUS | Status: DC

## 2016-09-07 MED ORDER — FENTANYL CITRATE (PF) 100 MCG/2ML IJ SOLN
INTRAMUSCULAR | Status: DC | PRN
  Administered 2016-09-07: 50 ug via INTRAVENOUS

## 2016-09-07 MED ORDER — SODIUM CHLORIDE 0.9 % WEIGHT BASED INFUSION: 3.0000 mL/kg/h | INTRAVENOUS | Status: DC

## 2016-09-07 MED ORDER — FENTANYL CITRATE (PF) 100 MCG/2ML IJ SOLN
INTRAMUSCULAR | Status: AC
  Filled 2016-09-07: qty 2

## 2016-09-07 MED ORDER — IOPAMIDOL (ISOVUE-300) INJECTION 61%
INTRAVENOUS | Status: DC | PRN
  Administered 2016-09-07: 110 mL via INTRA_ARTERIAL

## 2016-09-07 SURGICAL SUPPLY — 9 items
CATH 5FR JL4 DIAGNOSTIC (CATHETERS) ×2 IMPLANT
CATH 5FR PIGTAIL DIAGNOSTIC (CATHETERS) ×3 IMPLANT
CATH INFINITI JR4 5F (CATHETERS) ×3 IMPLANT
DEVICE CLOSURE MYNXGRIP 5F (Vascular Products) ×3 IMPLANT
KIT MANI 3VAL PERCEP (MISCELLANEOUS) ×3 IMPLANT
NEEDLE PERC 18GX7CM (NEEDLE) ×3 IMPLANT
PACK CARDIAC CATH (CUSTOM PROCEDURE TRAY) ×3 IMPLANT
SHEATH AVANTI 5FR X 11CM (SHEATH) ×3 IMPLANT
WIRE EMERALD 3MM-J .035X150CM (WIRE) ×3 IMPLANT

## 2016-09-07 NOTE — Discharge Instructions (Signed)

## 2016-12-01 ENCOUNTER — Other Ambulatory Visit: Payer: Self-pay

## 2016-12-01 DIAGNOSIS — I10 Essential (primary) hypertension: Secondary | ICD-10-CM | POA: Insufficient documentation

## 2016-12-01 DIAGNOSIS — E782 Mixed hyperlipidemia: Secondary | ICD-10-CM | POA: Insufficient documentation

## 2016-12-01 DIAGNOSIS — I2510 Atherosclerotic heart disease of native coronary artery without angina pectoris: Secondary | ICD-10-CM | POA: Insufficient documentation

## 2016-12-01 DIAGNOSIS — F4323 Adjustment disorder with mixed anxiety and depressed mood: Secondary | ICD-10-CM | POA: Insufficient documentation

## 2017-01-12 ENCOUNTER — Other Ambulatory Visit: Payer: Self-pay | Admitting: Physician Assistant

## 2017-01-12 NOTE — Telephone Encounter (Signed)
Med refill for lisinopril-hctz approved 

## 2017-02-11 ENCOUNTER — Emergency Department
Admission: EM | Admit: 2017-02-11 | Discharge: 2017-02-11 | Disposition: A | Discharge status: Home / Self Care | Payer: Managed Care, Other (non HMO) | Attending: Emergency Medicine | Admitting: Emergency Medicine

## 2017-02-11 ENCOUNTER — Encounter: Payer: Self-pay | Admitting: Emergency Medicine

## 2017-02-11 ENCOUNTER — Emergency Department: Payer: Managed Care, Other (non HMO)

## 2017-02-11 DIAGNOSIS — M545 Low back pain, unspecified: Secondary | ICD-10-CM

## 2017-02-11 DIAGNOSIS — Z79899 Other long term (current) drug therapy: Secondary | ICD-10-CM | POA: Diagnosis not present

## 2017-02-11 DIAGNOSIS — Y929 Unspecified place or not applicable: Secondary | ICD-10-CM | POA: Insufficient documentation

## 2017-02-11 DIAGNOSIS — Y939 Activity, unspecified: Secondary | ICD-10-CM | POA: Insufficient documentation

## 2017-02-11 DIAGNOSIS — Z87891 Personal history of nicotine dependence: Secondary | ICD-10-CM | POA: Diagnosis not present

## 2017-02-11 DIAGNOSIS — S3991XA Unspecified injury of abdomen, initial encounter: Secondary | ICD-10-CM | POA: Diagnosis present

## 2017-02-11 DIAGNOSIS — I10 Essential (primary) hypertension: Secondary | ICD-10-CM | POA: Diagnosis not present

## 2017-02-11 DIAGNOSIS — Y999 Unspecified external cause status: Secondary | ICD-10-CM | POA: Diagnosis not present

## 2017-02-11 DIAGNOSIS — W19XXXA Unspecified fall, initial encounter: Secondary | ICD-10-CM | POA: Diagnosis not present

## 2017-02-11 DIAGNOSIS — S39012A Strain of muscle, fascia and tendon of lower back, initial encounter: Secondary | ICD-10-CM | POA: Diagnosis not present

## 2017-02-11 LAB — CBC WITH DIFFERENTIAL/PLATELET
BASOS ABS: 0 10*3/uL (ref 0–0.1)
Basophils Relative: 0 %
EOS ABS: 0 10*3/uL (ref 0–0.7)
EOS PCT: 0 %
HCT: 41.8 % (ref 40.0–52.0)
Hemoglobin: 14.7 g/dL (ref 13.0–18.0)
Lymphocytes Relative: 10 %
Lymphs Abs: 1.1 10*3/uL (ref 1.0–3.6)
MCH: 32.5 pg (ref 26.0–34.0)
MCHC: 35.1 g/dL (ref 32.0–36.0)
MCV: 92.5 fL (ref 80.0–100.0)
MONO ABS: 0.5 10*3/uL (ref 0.2–1.0)
MONOS PCT: 5 %
NEUTROS ABS: 8.7 10*3/uL — AB (ref 1.4–6.5)
Neutrophils Relative %: 85 %
PLATELETS: 217 10*3/uL (ref 150–440)
RBC: 4.52 MIL/uL (ref 4.40–5.90)
RDW: 13.8 % (ref 11.5–14.5)
WBC: 10.4 10*3/uL (ref 3.8–10.6)

## 2017-02-11 LAB — GLUCOSE, CAPILLARY: Glucose-Capillary: 124 mg/dL — ABNORMAL HIGH (ref 65–99)

## 2017-02-11 LAB — COMPREHENSIVE METABOLIC PANEL
ALBUMIN: 4.8 g/dL (ref 3.5–5.0)
ALK PHOS: 63 U/L (ref 38–126)
ALT: 38 U/L (ref 17–63)
ANION GAP: 15 (ref 5–15)
AST: 54 U/L — ABNORMAL HIGH (ref 15–41)
BUN: 10 mg/dL (ref 6–20)
CALCIUM: 9.2 mg/dL (ref 8.9–10.3)
CO2: 22 mmol/L (ref 22–32)
Chloride: 97 mmol/L — ABNORMAL LOW (ref 101–111)
Creatinine, Ser: 0.81 mg/dL (ref 0.61–1.24)
GFR calc Af Amer: >60 mL/min (ref >60)
GFR calc non Af Amer: >60 mL/min (ref >60)
GLUCOSE: 149 mg/dL — AB (ref 65–99)
Potassium: 3.4 mmol/L — ABNORMAL LOW (ref 3.5–5.1)
Sodium: 134 mmol/L — ABNORMAL LOW (ref 135–145)
TOTAL PROTEIN: 7.9 g/dL (ref 6.5–8.1)
Total Bilirubin: 0.7 mg/dL (ref 0.3–1.2)

## 2017-02-11 LAB — URINALYSIS, ROUTINE W REFLEX MICROSCOPIC
Bacteria, UA: NONE SEEN
Bilirubin Urine: NEGATIVE
GLUCOSE, UA: NEGATIVE mg/dL
HGB URINE DIPSTICK: NEGATIVE
Ketones, ur: 5 mg/dL — AB
LEUKOCYTES UA: NEGATIVE
NITRITE: NEGATIVE
PROTEIN: 100 mg/dL — AB
SPECIFIC GRAVITY, URINE: 1.019 (ref 1.005–1.030)
SQUAMOUS EPITHELIAL / LPF: NONE SEEN
pH: 9 — ABNORMAL HIGH (ref 5.0–8.0)

## 2017-02-11 LAB — TROPONIN I: Troponin I: <0.03 ng/mL (ref <0.03)

## 2017-02-11 LAB — LIPASE, BLOOD: LIPASE: 12 U/L (ref 11–51)

## 2017-02-11 MED ORDER — LIDOCAINE 5 % EX PTCH: 1.0000 | MEDICATED_PATCH | Freq: Two times a day (BID) | CUTANEOUS | 0 refills | Status: DC

## 2017-02-11 MED ORDER — LORAZEPAM 2 MG/ML IJ SOLN
1.0000 mg | Freq: Once | INTRAMUSCULAR | Status: AC
  Administered 2017-02-11: 1 mg via INTRAVENOUS
  Filled 2017-02-11: qty 1

## 2017-02-11 MED ORDER — KETOROLAC TROMETHAMINE 30 MG/ML IJ SOLN
15.0000 mg | Freq: Once | INTRAMUSCULAR | Status: AC
  Administered 2017-02-11: 15 mg via INTRAVENOUS
  Filled 2017-02-11: qty 1

## 2017-02-11 MED ORDER — ONDANSETRON HCL 4 MG/2ML IJ SOLN
4.0000 mg | INTRAMUSCULAR | Status: AC
  Administered 2017-02-11: 4 mg via INTRAVENOUS
  Filled 2017-02-11: qty 2

## 2017-02-11 MED ORDER — SODIUM CHLORIDE 0.9 % IV BOLUS (SEPSIS)
1000.0000 mL | INTRAVENOUS | Status: AC
  Administered 2017-02-11: 1000 mL via INTRAVENOUS

## 2017-02-11 MED ORDER — LIDOCAINE 5 % EX PTCH
1.0000 | MEDICATED_PATCH | CUTANEOUS | Status: DC
  Administered 2017-02-11: 1 via TRANSDERMAL
  Filled 2017-02-11: qty 1

## 2017-02-11 MED ORDER — CYCLOBENZAPRINE HCL 10 MG PO TABS
5.0000 mg | ORAL_TABLET | Freq: Once | ORAL | Status: AC
  Administered 2017-02-11: 5 mg via ORAL
  Filled 2017-02-11: qty 1

## 2017-02-11 MED ORDER — CYCLOBENZAPRINE HCL 5 MG PO TABS: 5.0000 mg | ORAL_TABLET | Freq: Three times a day (TID) | ORAL | 0 refills | Status: DC | PRN

## 2017-02-11 MED ORDER — MORPHINE SULFATE (PF) 4 MG/ML IV SOLN
4.0000 mg | Freq: Once | INTRAVENOUS | Status: AC
  Administered 2017-02-11: 4 mg via INTRAVENOUS
  Filled 2017-02-11: qty 1

## 2017-02-11 NOTE — ED Triage Notes (Signed)
Pt states was having "pretty significant lower back pain" x 1 week. Pt c/o bilateral lower back pain at this time, denies hx of kidney stones. Pt states fell on Tuesday 02/06/17. Pt also c/o emesis at this time, pt states today began to feel nauseous and started to vomit. Pt states he feels like he is going to pass out in triage at this time, c/o being very hot and feels weak. Pt is noted to be flushed at this time.

## 2017-02-11 NOTE — Discharge Instructions (Signed)

## 2017-02-11 NOTE — ED Notes (Signed)
Pt given diet coke. 

## 2017-02-11 NOTE — ED Provider Notes (Signed)
[facility] Emergency Department Provider Note  ____________________________________________   First MD Initiated Contact with Patient 02/11/17 2002     (approximate)  I have reviewed the triage vital signs and the nursing notes.   HISTORY  Chief Complaint Back Pain and Emesis    HPI Dennis Davidson is a 41 y.o. male with a history of chronic back issues, specifically degenerative disc disease in his lower back, who presents for evaluation of acute worsening of his chronic back pain.  He states that it has been gradually getting worse over the last week after he had a mechanical fall.  He said that today he got acutely worse because he was sick to his stomach and vomited repeatedly throughout the course of the day which very much exacerbated his back problems.  After that happened he has constant severe pain in his low back that is not radiating down his legs but he also has some decreased sensation around in his pelvic area.  He also notes that it has been difficult for him to urinate since coming to the emergency department.  He has not had any urinary incontinence or fecal incontinence.  The pain does not radiate up his spine beyond the lumbar region.  He denies fever/chills, chest pain, shortness of breath.  He states that he still is mildly nauseated at this time but has no abdominal pain and the nausea is better.  He has had no diarrhea.  Overall the back pain is severe and nothing is making it better and any amount of movement makes it worse.   Past Medical History:  Diagnosis Date  . Headache    secondary to bulging disc  . Hypertension     There are no active problems to display for this patient.   Past Surgical History:  Procedure Laterality Date  . CARDIAC CATHETERIZATION Left 09/07/2016   Procedure: Left Heart Cath and Coronary Angiography;  Surgeon: Alwyn Peawayne D Callwood, MD;  Location: [facility] INVASIVE CV LAB;  Service: Cardiovascular;  Laterality:  Left;  . MOUTH SURGERY      Prior to Admission medications   Medication Sig Start Date End Date Taking? Authorizing Provider  ALPRAZolam Prudy Feeler(XANAX) 0.5 MG tablet Take 0.5 mg by mouth 3 (three) times daily as needed.   Yes Historical Provider, MD  atorvastatin (LIPITOR) 80 MG tablet Take 80 mg by mouth every evening. 02/07/17  Yes Historical Provider, MD  escitalopram (LEXAPRO) 20 MG tablet Take 20 mg by mouth daily. 11/13/16 02/11/17 Yes Historical Provider, MD  fluticasone (FLONASE) 50 MCG/ACT nasal spray Place 2 sprays into both nostrils daily. 04/12/16  Yes Faythe GheeSusan W Fisher, PA-C  gemfibrozil (LOPID) 600 MG tablet take 1 tablet by mouth once daily 01/17/16  Yes Faythe GheeSusan W Fisher, PA-C  lisinopril-hydrochlorothiazide (PRINZIDE,ZESTORETIC) 10-12.5 MG tablet TAKE ONE TABLET EVERY DAY 01/12/17  Yes Faythe GheeSusan W Fisher, PA-C  metoprolol tartrate (LOPRESSOR) 25 MG tablet Take 25 mg by mouth 2 (two) times daily. 02/07/17  Yes Historical Provider, MD  traZODone (DESYREL) 150 MG tablet Take 1 tablet (150 mg total) by mouth at bedtime as needed for sleep. 08/28/16  Yes Faythe GheeSusan W Fisher, PA-C  Vitamin D, Ergocalciferol, (DRISDOL) 50000 units CAPS capsule Take 50,000 Units by mouth every 7 (seven) days.   Yes Historical Provider, MD  cyclobenzaprine (FLEXERIL) 5 MG tablet Take 1 tablet (5 mg total) by mouth 3 (three) times daily as needed for muscle spasms. 02/11/17   Loleta Roseory Kimie Pidcock, MD  isosorbide mononitrate (ISMO,MONOKET) 10  MG tablet Take 10 mg by mouth 2 (two) times daily.    Historical Provider, MD  lidocaine (LIDODERM) 5 % Place 1 patch onto the skin every 12 (twelve) hours. Remove & Discard patch within 12 hours or as directed by MD.  Wynelle Fanny the patch off for 12 hours before applying a new one. 02/11/17 02/11/18  Loleta Rose, MD  metoprolol succinate (TOPROL-XL) 25 MG 24 hr tablet Take 1 tablet (25 mg total) by mouth daily. Patient not taking: Reported on 02/11/2017 08/28/16   Faythe Ghee, PA-C    Allergies Patient has  no known allergies.  History reviewed. No pertinent family history.  Social History Social History  Substance Use Topics  . Smoking status: Former Smoker    Quit date: 12/08/2007  . Smokeless tobacco: Never Used  . Alcohol use 0.0 oz/week     Comment: 1-2 month    Review of Systems Constitutional: No fever/chills Eyes: No visual changes. ENT: No sore throat. Cardiovascular: Denies chest pain. Respiratory: Denies shortness of breath. Gastrointestinal: No abdominal pain.  Numerous episodes of nausea and vomiting earlier today, now improved.  No diarrhea.  No constipation. Genitourinary: Negative for dysuria.  Positive for questionable urinary retention or difficulty with urination Musculoskeletal: Acute on chronic severe lower back pain  Skin: Negative for rash. Neurological: Subjective decrease in sensation around in the pelvic region.  10-point ROS otherwise negative.  ____________________________________________   PHYSICAL EXAM:  VITAL SIGNS: ED Triage Vitals  Enc Vitals Group     BP 02/11/17 1854 (!) 142/86     Pulse Rate 02/11/17 1854 (!) 110     Resp 02/11/17 1854 (!) 24     Temp 02/11/17 1854 98.5 F (36.9 C)     Temp Source 02/11/17 1854 Oral     SpO2 02/11/17 1854 100 %     Weight 02/11/17 1852 165 lb (74.8 kg)     Height 02/11/17 1852 5\' 7"  (1.702 m)     Head Circumference --      Peak Flow --      Pain Score 02/11/17 1852 8     Pain Loc --      Pain Edu? --      Excl. in GC? --     Constitutional: Alert and oriented. Appears to be in mild distress and discomfort lying in bed Eyes: Conjunctivae are normal. PERRL. EOMI. Head: Atraumatic. Nose: No congestion/rhinnorhea. Mouth/Throat: Mucous membranes are moist. Neck: No stridor.  No meningeal signs.   Cardiovascular: Normal rate, regular rhythm. Good peripheral circulation. Grossly normal heart sounds. Respiratory: Normal respiratory effort.  No retractions. Lungs CTAB. Gastrointestinal: Soft and  nontender. No distention.  Musculoskeletal: No lower extremity tenderness nor edema. No gross deformities of extremities. Neurologic:  Normal speech and language. No gross focal neurologic deficits are appreciated for the subjective decrease in sensation in the saddle distribution.  He has normal strength in bilateral lower extremities Skin:  Skin is warm, dry and intact. No rash noted. Psychiatric: Mood and affect are normal. Speech and behavior are normal.  ____________________________________________   LABS (all labs ordered are listed, but only abnormal results are displayed)  Labs Reviewed  GLUCOSE, CAPILLARY - Abnormal; Notable for the following:       Result Value   Glucose-Capillary 124 (*)    All other components within normal limits  COMPREHENSIVE METABOLIC PANEL - Abnormal; Notable for the following:    Sodium 134 (*)    Potassium 3.4 (*)    Chloride 97 (*)  Glucose, Bld 149 (*)    AST 54 (*)    All other components within normal limits  CBC WITH DIFFERENTIAL/PLATELET - Abnormal; Notable for the following:    Neutro Abs 8.7 (*)    All other components within normal limits  URINALYSIS, ROUTINE W REFLEX MICROSCOPIC - Abnormal; Notable for the following:    Color, Urine YELLOW (*)    APPearance CLEAR (*)    pH 9.0 (*)    Ketones, ur 5 (*)    Protein, ur 100 (*)    All other components within normal limits  TROPONIN I  LIPASE, BLOOD   ____________________________________________  EKG  None - EKG not ordered by ED physician ____________________________________________  RADIOLOGY   Mr Lumbar Spine Wo Contrast  Result Date: 02/11/2017 CLINICAL DATA:  Low back pain EXAM: MRI LUMBAR SPINE WITHOUT CONTRAST TECHNIQUE: Multiplanar, multisequence MR imaging of the lumbar spine was performed. No intravenous contrast was administered. COMPARISON:  None. FINDINGS: Segmentation:  Standard Alignment:  Normal Vertebrae: No acute compression fracture, discitis-osteomyelitis,  facet edema or other focal marrow lesion. No epidural collection. Conus medullaris: Extends to the L2 level and appears normal. Paraspinal and other soft tissues: The visualized aorta, IVC and iliac vessels are normal. The visualized retroperitoneal organs and paraspinal soft tissues are normal. Disc levels: T12-L1: Normal disc space and facets. No spinal canal or neuroforaminal stenosis. L1-L2: Normal disc space and facets. No spinal canal or neuroforaminal stenosis. L2-L3: Normal disc space and facets. No spinal canal or neuroforaminal stenosis. L3-L4: Normal disc space and facets. No spinal canal or neuroforaminal stenosis. L4-L5: Small disc bulge without spinal canal or neural foraminal stenosis. L5-S1: Mild facet hypertrophy. No spinal canal or neural foraminal stenosis. Visualized sacrum: Normal. IMPRESSION: Minimal degenerative disease. No spinal canal, subarticular recess or neural foraminal narrowing. Electronically Signed   By: Deatra Robinson M.D.   On: 02/11/2017 22:35    ____________________________________________   PROCEDURES  Procedure(s) performed:   Procedures   Critical Care performed: No ____________________________________________   INITIAL IMPRESSION / ASSESSMENT AND PLAN / ED COURSE  Pertinent labs & imaging results that were available during my care of the patient were reviewed by me and considered in my medical decision making (see chart for details).  Although I think the probability of cauda equina is low, I need to evaluate for it given the acute worsening of his chronic lumbar pain with decreased sensation in the saddle distribution and difficulty with urination.  I will give him some morphine and Zofran in the meantime for pain and nausea control and a liter of fluids given all the vomiting earlier today.  His abdomen is currently soft, nondistended, and nontender and he has no abdominal pain.  This all this with the patient and he understands and agreed.  He is a  [employer] and adamantly denies the use of any IV drugs and I feel that it is very unlikely that he would have an epidural abscess or other acute infectious process that would require IV contrast.  Lab work is essentially normal with no leukocytosis.   Clinical Course as of Feb 12 2328  Wynelle Link Feb 11, 2017  2324 Updated patient with reassuring MRI results.  States he feels better, still having low back pain/spasms, no additional vomiting.  Will treat with Lidoderm, Toradol 15 mg IV, and Flexeril 5 mg PO.  Advised close outpatient follow up with Dr. Yves Dill or orthopedics, but no evidence of acute or emergent medical condition at this time.  Patient and spouse understand and agree with the plan.  [CF]    Clinical Course User Index [CF] Loleta Rose, MD    ____________________________________________  FINAL CLINICAL IMPRESSION(S) / ED DIAGNOSES  Final diagnoses:  Acute low back pain  Strain of lumbar region, initial encounter     MEDICATIONS GIVEN DURING THIS VISIT:  Medications  lidocaine (LIDODERM) 5 % 1 patch (not administered)  ketorolac (TORADOL) 30 MG/ML injection 15 mg (not administered)  ondansetron (ZOFRAN) injection 4 mg (not administered)  cyclobenzaprine (FLEXERIL) tablet 5 mg (not administered)  sodium chloride 0.9 % bolus 1,000 mL (0 mLs Intravenous Stopped 02/11/17 2134)  ondansetron (ZOFRAN) injection 4 mg (4 mg Intravenous Given 02/11/17 2029)  morphine 4 MG/ML injection 4 mg (4 mg Intravenous Given 02/11/17 2031)  LORazepam (ATIVAN) injection 1 mg (1 mg Intravenous Given 02/11/17 2135)     NEW OUTPATIENT MEDICATIONS STARTED DURING THIS VISIT:  New Prescriptions   CYCLOBENZAPRINE (FLEXERIL) 5 MG TABLET    Take 1 tablet (5 mg total) by mouth 3 (three) times daily as needed for muscle spasms.   LIDOCAINE (LIDODERM) 5 %    Place 1 patch onto the skin every 12 (twelve) hours. Remove & Discard patch within 12 hours or as directed by MD.  Wynelle Fanny the patch off for 12  hours before applying a new one.    Modified Medications   No medications on file    Discontinued Medications   No medications on file     Note:  This document was prepared using Dragon voice recognition software and may include unintentional dictation errors.    Loleta Rose, MD 02/11/17 2329

## 2017-05-31 ENCOUNTER — Ambulatory Visit: Payer: Self-pay | Admitting: Physician Assistant

## 2017-05-31 VITALS — BP 140/80 | HR 94 | Temp 98.5°F | Resp 16

## 2017-05-31 DIAGNOSIS — M25512 Pain in left shoulder: Secondary | ICD-10-CM

## 2017-05-31 NOTE — Progress Notes (Signed)
Subjective:    Patient ID: Dennis CaffeyJoshua M Davidson, male    DOB: [birth year], 41 y.o.   MRN: [id number]  HPI  Patient is a 41 year old male who walked into clinic today with complaint of left shoulder pain for 2 weeks. He does report having a cold two weeks ago with increased coughing that has resolved. He reports that this is when he first noticed the shoulder pain. He is not able to reproduce pain but does report " it feels like a tight muscle" . He has taken Motrin 200 mg one tablet alternating with tylenol with some relief. Denies any known injury. Denies chest pain, shortness of breath, cough, nausea, vomiting or diarrhea.  He has history of cardiac catherization in October 2017 showing moderate coronary disease per Dr. Roslynn Ambleallwoods office note 11/10/2016 and is due to follow up with Dr. Juliann Paresallwood next month and last follow up was January 2018    Review of Systems  Constitutional: Negative for activity change, appetite change, chills, diaphoresis, fatigue, fever and unexpected weight change.  HENT: Negative.   Eyes: Negative.   Respiratory: Negative for apnea, cough, choking, chest tightness, shortness of breath, wheezing and stridor.   Cardiovascular: Negative for chest pain, palpitations and leg swelling.  Gastrointestinal: Negative for abdominal distention, abdominal pain, constipation, diarrhea, nausea and vomiting.  Endocrine: Negative for polydipsia, polyphagia and polyuria.  Genitourinary: Negative for difficulty urinating and dysuria.  Musculoskeletal: Positive for arthralgias (left shoulder). Negative for back pain, gait problem, joint swelling, myalgias, neck pain and neck stiffness.  Neurological: Negative for dizziness, syncope, facial asymmetry, weakness, light-headedness, numbness and headaches.  Hematological: Does not bruise/bleed easily.  Psychiatric/Behavioral: Negative for agitation, behavioral problems and confusion.       Objective:   Physical Exam  Constitutional: He is  oriented to person, place, and time. He appears well-developed and well-nourished. No distress.  HENT:  Head: Normocephalic and atraumatic.  Eyes: Conjunctivae and EOM are normal. Pupils are equal, round, and reactive to light.  Neck: Normal range of motion. Neck supple.  Cardiovascular: Normal rate, regular rhythm and normal heart sounds.  Exam reveals no gallop and no friction rub.   No murmur heard. Pulmonary/Chest: Effort normal and breath sounds normal. No respiratory distress. He has no wheezes. He has no rales. He exhibits no tenderness.  Abdominal: Soft.  Musculoskeletal: Normal range of motion.       Arms: Left shoulder pain area marked.   Neurological: He is alert and oriented to person, place, and time. He has normal strength and normal reflexes.  Skin: Skin is warm and dry. He is not diaphoretic.  Psychiatric: He has a normal mood and affect. His behavior is normal. Judgment and thought content normal.   No pain with movement, normal radial pulses, unable to reproduce shoulder pain. Patient rates pain currently 6/10 on pain scale. Pain was 7/10 earlier per patient. Unable to reproduce muscular pain.   Discussed patient with supervising Rachel Boicharg Gilbert MD who is in agreement to have patinet seen by cardiology to rule out cardiac origin. Sent EKG to ElizabethGilbert for review.Dr. Sullivan LoneGilbert advised 800 mg Ibuprofen every 8 hours.      Assesment and Plan   1. EKG Abnormal 2. Patient appointment schedule with Dr. Roslynn Ambleallwoods office now at 2:00. Patient offered courtesy car, nut prefers to drive [facility]. Patient verbalizes understanding of instructions and will be seen in Dr. Callwoods(cardiology)  office now.  3. Patient will call 911 if any symptoms of  chest pain shortness of breath or any other cardiac symptoms. Patient verbalizes understanding and will follow up with clinic as needed after cardiology appointment. Patient verbalizes understanding.  4. Follow  instructions of cardiology, and go to the ER if symptoms change or worsen at anytime. Patient verbalized understanding. May take 800mg  of Motrin every 8 hours as needed if cleared by cardiology.  Return to clinic as needed.

## 2017-06-01 NOTE — Addendum Note (Signed)
Addended by: Catha BrowEACON, Tobby Fawcett T on: 06/01/2017 10:28 AM   Modules accepted: Orders

## 2017-06-19 ENCOUNTER — Ambulatory Visit: Payer: Self-pay | Admitting: Physician Assistant

## 2017-06-20 ENCOUNTER — Ambulatory Visit: Payer: Self-pay | Admitting: Physician Assistant

## 2017-06-20 ENCOUNTER — Encounter: Payer: Self-pay | Admitting: Physician Assistant

## 2017-06-20 VITALS — BP 120/80 | HR 106 | Temp 98.5°F | Resp 16

## 2017-06-20 DIAGNOSIS — M62838 Other muscle spasm: Secondary | ICD-10-CM

## 2017-06-20 DIAGNOSIS — I499 Cardiac arrhythmia, unspecified: Secondary | ICD-10-CM | POA: Insufficient documentation

## 2017-06-20 MED ORDER — BACLOFEN 10 MG PO TABS: 10.0000 mg | ORAL_TABLET | Freq: Three times a day (TID) | ORAL | 0 refills | Status: DC

## 2017-06-20 MED ORDER — METHYLPREDNISOLONE 4 MG PO TBPK: ORAL_TABLET | ORAL | 0 refills | Status: DC

## 2017-06-20 NOTE — Progress Notes (Signed)
S: c/o left shoulder pain, spasm; states its been going on for awhile, feels really tight, has full rom but can feel it pull, had a large knot in the shoulder last week, worked really hard to get it to relax, did a little now its back, no numbness or tingling, no loss of grip  O: vitals wnl, nad, cspine is not tender, left shoulder is spasmed in trapezious and supraspinatus, full rom, grips = b/l; n/v intact  A: muscle spasm in left shoulder  P: medrol dose pack, baclofen

## 2017-07-31 ENCOUNTER — Encounter: Payer: Self-pay | Admitting: Physician Assistant

## 2017-07-31 ENCOUNTER — Ambulatory Visit: Payer: Self-pay | Admitting: Physician Assistant

## 2017-07-31 VITALS — BP 120/80 | HR 107 | Temp 98.5°F | Resp 16

## 2017-07-31 DIAGNOSIS — M5412 Radiculopathy, cervical region: Secondary | ICD-10-CM

## 2017-07-31 MED ORDER — CYCLOBENZAPRINE HCL 10 MG PO TABS: 10.0000 mg | ORAL_TABLET | Freq: Three times a day (TID) | ORAL | 0 refills | Status: DC | PRN

## 2017-07-31 MED ORDER — PREDNISONE 10 MG (21) PO TBPK: ORAL_TABLET | ORAL | 0 refills | Status: DC

## 2017-07-31 NOTE — Progress Notes (Signed)
S: c/o left shoulder and neck pain, some weakness in left hand, pain radiates to center of back at times, no numbness or tingling, pain will radiate to hand, sx on and off for several weeks to months, few years ago had a mri which showed bulging discs in his neck but he was able to get better without surgery  O: vitals wnl, nad, cspine is a little tender, decreased rom with hyperextension, left shoulder has spasms, grip decreased in left hand, n/v intact  A: cervical radiculopathy  P: medrol dose pack, flexeril, ice, will refer to ortho

## 2017-08-01 NOTE — Progress Notes (Signed)
Referral request was faxed to [facility] per Susan's authorization.

## 2017-08-06 NOTE — Progress Notes (Signed)
Patient has been referred to [facility] on 08-09-2017 at 2pm.  Patient has been notified.

## 2017-08-20 ENCOUNTER — Other Ambulatory Visit: Payer: Self-pay | Admitting: Physician Assistant

## 2017-08-20 NOTE — Telephone Encounter (Signed)
Med refill for lisinopril-hctz approved 

## 2017-12-27 ENCOUNTER — Other Ambulatory Visit: Payer: Self-pay | Admitting: Orthopedic Surgery

## 2017-12-27 DIAGNOSIS — M5412 Radiculopathy, cervical region: Secondary | ICD-10-CM

## 2018-01-05 ENCOUNTER — Ambulatory Visit
Admission: RE | Admit: 2018-01-05 | Discharge: 2018-01-05 | Disposition: A | Discharge status: Home / Self Care | Payer: Managed Care, Other (non HMO) | Source: Ambulatory Visit | Attending: Orthopedic Surgery | Admitting: Orthopedic Surgery

## 2018-01-05 DIAGNOSIS — M5412 Radiculopathy, cervical region: Secondary | ICD-10-CM

## 2018-01-15 ENCOUNTER — Other Ambulatory Visit: Payer: Self-pay | Admitting: Orthopedic Surgery

## 2018-01-15 DIAGNOSIS — M50122 Cervical disc disorder at C5-C6 level with radiculopathy: Secondary | ICD-10-CM

## 2018-01-28 ENCOUNTER — Ambulatory Visit
Admission: RE | Admit: 2018-01-28 | Discharge: 2018-01-28 | Disposition: A | Discharge status: Home / Self Care | Payer: Managed Care, Other (non HMO) | Source: Ambulatory Visit | Attending: Orthopedic Surgery | Admitting: Orthopedic Surgery

## 2018-01-28 DIAGNOSIS — M50122 Cervical disc disorder at C5-C6 level with radiculopathy: Secondary | ICD-10-CM

## 2018-01-28 MED ORDER — IOPAMIDOL (ISOVUE-M 300) INJECTION 61%
1.0000 mL | Freq: Once | INTRAMUSCULAR | Status: AC | PRN
  Administered 2018-01-28: 1 mL via EPIDURAL

## 2018-01-28 MED ORDER — TRIAMCINOLONE ACETONIDE 40 MG/ML IJ SUSP (RADIOLOGY)
60.0000 mg | Freq: Once | INTRAMUSCULAR | Status: AC
  Administered 2018-01-28: 60 mg via EPIDURAL

## 2018-01-28 NOTE — Discharge Instructions (Signed)

## 2018-07-18 DIAGNOSIS — M50122 Cervical disc disorder at C5-C6 level with radiculopathy: Secondary | ICD-10-CM | POA: Insufficient documentation

## 2018-07-26 ENCOUNTER — Inpatient Hospital Stay: Admission: RE | Admit: 2018-07-26 | Payer: Self-pay | Source: Ambulatory Visit

## 2018-07-30 ENCOUNTER — Inpatient Hospital Stay: Admission: RE | Admit: 2018-07-30 | Payer: Self-pay | Source: Ambulatory Visit

## 2018-08-01 ENCOUNTER — Inpatient Hospital Stay: Admission: RE | Admit: 2018-08-01 | Payer: Self-pay | Source: Ambulatory Visit

## 2018-08-02 ENCOUNTER — Inpatient Hospital Stay: Admission: RE | Admit: 2018-08-02 | Payer: Self-pay | Source: Ambulatory Visit

## 2018-08-05 ENCOUNTER — Inpatient Hospital Stay: Payer: Managed Care, Other (non HMO) | Admitting: Certified Registered Nurse Anesthetist

## 2018-08-05 ENCOUNTER — Encounter: Payer: Self-pay | Admitting: *Deleted

## 2018-08-05 ENCOUNTER — Inpatient Hospital Stay: Payer: Managed Care, Other (non HMO)

## 2018-08-05 ENCOUNTER — Encounter
Admission: RE | Disposition: A | Discharge status: Home / Self Care | Payer: Self-pay | Source: Home / Self Care | Attending: Neurosurgery

## 2018-08-05 ENCOUNTER — Inpatient Hospital Stay
Admission: RE | Admit: 2018-08-05 | Discharge: 2018-08-06 | DRG: 472 | Disposition: A | Discharge status: Home / Self Care | Payer: Managed Care, Other (non HMO) | Attending: Neurosurgery | Admitting: Neurosurgery

## 2018-08-05 ENCOUNTER — Other Ambulatory Visit: Payer: Self-pay

## 2018-08-05 DIAGNOSIS — Z01818 Encounter for other preprocedural examination: Secondary | ICD-10-CM

## 2018-08-05 DIAGNOSIS — Z87891 Personal history of nicotine dependence: Secondary | ICD-10-CM

## 2018-08-05 DIAGNOSIS — Z981 Arthrodesis status: Secondary | ICD-10-CM

## 2018-08-05 DIAGNOSIS — Z23 Encounter for immunization: Secondary | ICD-10-CM

## 2018-08-05 DIAGNOSIS — I10 Essential (primary) hypertension: Secondary | ICD-10-CM | POA: Diagnosis present

## 2018-08-05 DIAGNOSIS — G992 Myelopathy in diseases classified elsewhere: Secondary | ICD-10-CM | POA: Diagnosis present

## 2018-08-05 DIAGNOSIS — Z419 Encounter for procedure for purposes other than remedying health state, unspecified: Secondary | ICD-10-CM

## 2018-08-05 DIAGNOSIS — Z79899 Other long term (current) drug therapy: Secondary | ICD-10-CM

## 2018-08-05 DIAGNOSIS — M4802 Spinal stenosis, cervical region: Principal | ICD-10-CM | POA: Diagnosis present

## 2018-08-05 DIAGNOSIS — G959 Disease of spinal cord, unspecified: Secondary | ICD-10-CM | POA: Diagnosis present

## 2018-08-05 DIAGNOSIS — M5412 Radiculopathy, cervical region: Secondary | ICD-10-CM | POA: Diagnosis present

## 2018-08-05 HISTORY — PX: ANTERIOR CERVICAL DECOMP/DISCECTOMY FUSION: SHX1161

## 2018-08-05 LAB — CBC WITH DIFFERENTIAL/PLATELET
BASOS PCT: 0 %
Basophils Absolute: 0 10*3/uL (ref 0–0.1)
Eosinophils Absolute: 0.2 10*3/uL (ref 0–0.7)
Eosinophils Relative: 2 %
HEMATOCRIT: 41.2 % (ref 40.0–52.0)
Hemoglobin: 14.7 g/dL (ref 13.0–18.0)
Lymphocytes Relative: 18 %
Lymphs Abs: 1.3 10*3/uL (ref 1.0–3.6)
MCH: 34.6 pg — ABNORMAL HIGH (ref 26.0–34.0)
MCHC: 35.6 g/dL (ref 32.0–36.0)
MCV: 97 fL (ref 80.0–100.0)
MONO ABS: 0.4 10*3/uL (ref 0.2–1.0)
Monocytes Relative: 5 %
NEUTROS ABS: 5.3 10*3/uL (ref 1.4–6.5)
NEUTROS PCT: 75 %
Platelets: 123 10*3/uL — ABNORMAL LOW (ref 150–440)
RBC: 4.24 MIL/uL — ABNORMAL LOW (ref 4.40–5.90)
RDW: 14.3 % (ref 11.5–14.5)
WBC: 7.2 10*3/uL (ref 3.8–10.6)

## 2018-08-05 LAB — PROTIME-INR
INR: 0.81
PROTHROMBIN TIME: 11.1 s — AB (ref 11.4–15.2)

## 2018-08-05 LAB — URINALYSIS, ROUTINE W REFLEX MICROSCOPIC
BILIRUBIN URINE: NEGATIVE
Glucose, UA: NEGATIVE mg/dL
Hgb urine dipstick: NEGATIVE
KETONES UR: NEGATIVE mg/dL
LEUKOCYTES UA: NEGATIVE
NITRITE: NEGATIVE
PROTEIN: NEGATIVE mg/dL
Specific Gravity, Urine: 1.015 (ref 1.005–1.030)
pH: 7 (ref 5.0–8.0)

## 2018-08-05 LAB — BASIC METABOLIC PANEL
Anion gap: 12 (ref 5–15)
BUN: 8 mg/dL (ref 6–20)
CALCIUM: 9.6 mg/dL (ref 8.9–10.3)
CO2: 27 mmol/L (ref 22–32)
CREATININE: 0.76 mg/dL (ref 0.61–1.24)
Chloride: 97 mmol/L — ABNORMAL LOW (ref 98–111)
Glucose, Bld: 109 mg/dL — ABNORMAL HIGH (ref 70–99)
Potassium: 3.4 mmol/L — ABNORMAL LOW (ref 3.5–5.1)
SODIUM: 136 mmol/L (ref 135–145)

## 2018-08-05 LAB — TYPE AND SCREEN
ABO/RH(D): A POS
ANTIBODY SCREEN: NEGATIVE

## 2018-08-05 LAB — ABO/RH: ABO/RH(D): A POS

## 2018-08-05 LAB — APTT: aPTT: 26 seconds (ref 24–36)

## 2018-08-05 SURGERY — ANTERIOR CERVICAL DECOMPRESSION/DISCECTOMY FUSION 2 LEVELS
Anesthesia: General | Site: Neck | Wound class: Clean

## 2018-08-05 MED ORDER — HYDROMORPHONE HCL 1 MG/ML IJ SOLN: 0.5000 mg | INTRAMUSCULAR | Status: DC | PRN

## 2018-08-05 MED ORDER — PROPOFOL 10 MG/ML IV BOLUS
INTRAVENOUS | Status: AC
  Filled 2018-08-05: qty 20

## 2018-08-05 MED ORDER — THROMBIN 5000 UNITS EX SOLR
CUTANEOUS | Status: AC
  Filled 2018-08-05: qty 5000

## 2018-08-05 MED ORDER — METOPROLOL TARTRATE 25 MG PO TABS
25.0000 mg | ORAL_TABLET | Freq: Two times a day (BID) | ORAL | Status: DC
  Administered 2018-08-05 – 2018-08-06 (×2): 25 mg via ORAL
  Filled 2018-08-05 (×2): qty 1

## 2018-08-05 MED ORDER — CEFAZOLIN SODIUM-DEXTROSE 1-4 GM/50ML-% IV SOLN
INTRAVENOUS | Status: AC
  Filled 2018-08-05: qty 50

## 2018-08-05 MED ORDER — ACETAMINOPHEN 500 MG PO TABS
1000.0000 mg | ORAL_TABLET | Freq: Four times a day (QID) | ORAL | Status: DC
  Administered 2018-08-05 – 2018-08-06 (×3): 1000 mg via ORAL
  Filled 2018-08-05 (×3): qty 2

## 2018-08-05 MED ORDER — DEXAMETHASONE SODIUM PHOSPHATE 10 MG/ML IJ SOLN
INTRAMUSCULAR | Status: AC
  Filled 2018-08-05: qty 1

## 2018-08-05 MED ORDER — SUGAMMADEX SODIUM 200 MG/2ML IV SOLN
INTRAVENOUS | Status: AC
  Filled 2018-08-05: qty 2

## 2018-08-05 MED ORDER — FENTANYL CITRATE (PF) 250 MCG/5ML IJ SOLN
INTRAMUSCULAR | Status: AC
  Filled 2018-08-05: qty 5

## 2018-08-05 MED ORDER — EPHEDRINE SULFATE 50 MG/ML IJ SOLN
INTRAMUSCULAR | Status: DC | PRN
  Administered 2018-08-05: 10 mg via INTRAVENOUS

## 2018-08-05 MED ORDER — PROPOFOL 500 MG/50ML IV EMUL
INTRAVENOUS | Status: AC
  Filled 2018-08-05: qty 100

## 2018-08-05 MED ORDER — OXYCODONE HCL 5 MG PO TABS
5.0000 mg | ORAL_TABLET | ORAL | Status: DC | PRN
  Administered 2018-08-05 – 2018-08-06 (×2): 5 mg via ORAL
  Filled 2018-08-05 (×2): qty 1

## 2018-08-05 MED ORDER — INFLUENZA VAC SPLIT QUAD 0.5 ML IM SUSY
0.5000 mL | PREFILLED_SYRINGE | INTRAMUSCULAR | Status: AC
  Administered 2018-08-06: 0.5 mL via INTRAMUSCULAR
  Filled 2018-08-05: qty 0.5

## 2018-08-05 MED ORDER — PROPOFOL 500 MG/50ML IV EMUL
INTRAVENOUS | Status: AC
  Filled 2018-08-05: qty 50

## 2018-08-05 MED ORDER — LIDOCAINE-EPINEPHRINE 1 %-1:100000 IJ SOLN
INTRAMUSCULAR | Status: DC | PRN
  Administered 2018-08-05: 10 mL

## 2018-08-05 MED ORDER — CEFAZOLIN SODIUM-DEXTROSE 1-4 GM/50ML-% IV SOLN
1.0000 g | Freq: Once | INTRAVENOUS | Status: AC
  Administered 2018-08-05: 1 g via INTRAVENOUS

## 2018-08-05 MED ORDER — ATORVASTATIN CALCIUM 20 MG PO TABS
80.0000 mg | ORAL_TABLET | Freq: Every evening | ORAL | Status: DC
  Administered 2018-08-05: 80 mg via ORAL
  Filled 2018-08-05: qty 4

## 2018-08-05 MED ORDER — ACETAMINOPHEN 10 MG/ML IV SOLN
INTRAVENOUS | Status: AC
  Filled 2018-08-05: qty 100

## 2018-08-05 MED ORDER — LACTATED RINGERS IV SOLN
INTRAVENOUS | Status: DC | PRN
  Administered 2018-08-05: 11:00:00 via INTRAVENOUS

## 2018-08-05 MED ORDER — LACTATED RINGERS IV SOLN
INTRAVENOUS | Status: DC
  Administered 2018-08-05: 09:00:00 via INTRAVENOUS

## 2018-08-05 MED ORDER — BACITRACIN 50000 UNITS IM SOLR
INTRAMUSCULAR | Status: AC
  Filled 2018-08-05: qty 1

## 2018-08-05 MED ORDER — DEXAMETHASONE SODIUM PHOSPHATE 10 MG/ML IJ SOLN
INTRAMUSCULAR | Status: DC | PRN
  Administered 2018-08-05: 10 mg via INTRAVENOUS

## 2018-08-05 MED ORDER — LISINOPRIL 10 MG PO TABS
10.0000 mg | ORAL_TABLET | Freq: Every day | ORAL | Status: DC
  Administered 2018-08-06: 10 mg via ORAL
  Filled 2018-08-05: qty 1

## 2018-08-05 MED ORDER — SODIUM CHLORIDE 0.9 % IR SOLN
Status: DC | PRN
  Administered 2018-08-05: 1000 mL

## 2018-08-05 MED ORDER — PROPOFOL 10 MG/ML IV BOLUS
INTRAVENOUS | Status: DC | PRN
  Administered 2018-08-05: 120 mg via INTRAVENOUS

## 2018-08-05 MED ORDER — FENTANYL CITRATE (PF) 100 MCG/2ML IJ SOLN
INTRAMUSCULAR | Status: AC
  Administered 2018-08-05: 25 ug via INTRAVENOUS
  Filled 2018-08-05: qty 2

## 2018-08-05 MED ORDER — ONDANSETRON HCL 4 MG/2ML IJ SOLN: 4.0000 mg | Freq: Four times a day (QID) | INTRAMUSCULAR | Status: DC | PRN

## 2018-08-05 MED ORDER — ONDANSETRON HCL 4 MG/2ML IJ SOLN
INTRAMUSCULAR | Status: DC | PRN
  Administered 2018-08-05: 4 mg via INTRAVENOUS

## 2018-08-05 MED ORDER — SENNOSIDES-DOCUSATE SODIUM 8.6-50 MG PO TABS: 1.0000 | ORAL_TABLET | Freq: Every evening | ORAL | Status: DC | PRN

## 2018-08-05 MED ORDER — ACETAMINOPHEN 10 MG/ML IV SOLN
INTRAVENOUS | Status: DC | PRN
  Administered 2018-08-05: 1000 mg via INTRAVENOUS

## 2018-08-05 MED ORDER — SUCCINYLCHOLINE CHLORIDE 20 MG/ML IJ SOLN
INTRAMUSCULAR | Status: AC
  Filled 2018-08-05: qty 1

## 2018-08-05 MED ORDER — MIDAZOLAM HCL 2 MG/2ML IJ SOLN
INTRAMUSCULAR | Status: DC | PRN
  Administered 2018-08-05: 2 mg via INTRAVENOUS

## 2018-08-05 MED ORDER — PHENOL 1.4 % MT LIQD
1.0000 | OROMUCOSAL | Status: DC | PRN
  Filled 2018-08-05: qty 177

## 2018-08-05 MED ORDER — SODIUM CHLORIDE 0.9% FLUSH: 3.0000 mL | INTRAVENOUS | Status: DC | PRN

## 2018-08-05 MED ORDER — KETAMINE HCL 10 MG/ML IJ SOLN
INTRAMUSCULAR | Status: DC | PRN
  Administered 2018-08-05: 50 mg via INTRAVENOUS

## 2018-08-05 MED ORDER — MIDAZOLAM HCL 2 MG/2ML IJ SOLN
INTRAMUSCULAR | Status: AC
  Filled 2018-08-05: qty 2

## 2018-08-05 MED ORDER — ONDANSETRON HCL 4 MG PO TABS: 4.0000 mg | ORAL_TABLET | Freq: Four times a day (QID) | ORAL | Status: DC | PRN

## 2018-08-05 MED ORDER — SUCCINYLCHOLINE CHLORIDE 20 MG/ML IJ SOLN
INTRAMUSCULAR | Status: DC | PRN
  Administered 2018-08-05: 80 mg via INTRAVENOUS

## 2018-08-05 MED ORDER — ROCURONIUM BROMIDE 50 MG/5ML IV SOLN
INTRAVENOUS | Status: AC
  Filled 2018-08-05: qty 1

## 2018-08-05 MED ORDER — SODIUM CHLORIDE 0.9 % IV SOLN
INTRAVENOUS | Status: DC | PRN
  Administered 2018-08-05: .1 ug/kg/min via INTRAVENOUS

## 2018-08-05 MED ORDER — REMIFENTANIL HCL 1 MG IV SOLR
INTRAVENOUS | Status: AC
  Filled 2018-08-05: qty 1000

## 2018-08-05 MED ORDER — PROPOFOL 500 MG/50ML IV EMUL
INTRAVENOUS | Status: DC | PRN
  Administered 2018-08-05: 120 ug/kg/min via INTRAVENOUS

## 2018-08-05 MED ORDER — FENTANYL CITRATE (PF) 100 MCG/2ML IJ SOLN
INTRAMUSCULAR | Status: DC | PRN
  Administered 2018-08-05: 100 ug via INTRAVENOUS
  Administered 2018-08-05: 50 ug via INTRAVENOUS

## 2018-08-05 MED ORDER — ESCITALOPRAM OXALATE 10 MG PO TABS
20.0000 mg | ORAL_TABLET | Freq: Every day | ORAL | Status: DC
  Administered 2018-08-06: 20 mg via ORAL
  Filled 2018-08-05: qty 2

## 2018-08-05 MED ORDER — LISINOPRIL-HYDROCHLOROTHIAZIDE 10-12.5 MG PO TABS: 1.0000 | ORAL_TABLET | Freq: Every day | ORAL | Status: DC

## 2018-08-05 MED ORDER — THROMBIN 5000 UNITS EX SOLR
CUTANEOUS | Status: DC | PRN
  Administered 2018-08-05: 5000 [IU] via TOPICAL

## 2018-08-05 MED ORDER — METHYLPREDNISOLONE ACETATE 40 MG/ML IJ SUSP
INTRAMUSCULAR | Status: AC
  Filled 2018-08-05: qty 1

## 2018-08-05 MED ORDER — LIDOCAINE HCL (CARDIAC) PF 100 MG/5ML IV SOSY
PREFILLED_SYRINGE | INTRAVENOUS | Status: DC | PRN
  Administered 2018-08-05: 60 mg via INTRAVENOUS

## 2018-08-05 MED ORDER — ACETAMINOPHEN 650 MG RE SUPP: 650.0000 mg | RECTAL | Status: DC | PRN

## 2018-08-05 MED ORDER — ROCURONIUM BROMIDE 100 MG/10ML IV SOLN
INTRAVENOUS | Status: DC | PRN
  Administered 2018-08-05: 10 mg via INTRAVENOUS

## 2018-08-05 MED ORDER — FENTANYL CITRATE (PF) 100 MCG/2ML IJ SOLN
25.0000 ug | INTRAMUSCULAR | Status: DC | PRN
  Administered 2018-08-05 (×4): 25 ug via INTRAVENOUS

## 2018-08-05 MED ORDER — ALPRAZOLAM 0.5 MG PO TABS
0.5000 mg | ORAL_TABLET | Freq: Three times a day (TID) | ORAL | Status: DC | PRN
  Administered 2018-08-06: 0.5 mg via ORAL
  Filled 2018-08-05: qty 1

## 2018-08-05 MED ORDER — OXYCODONE HCL 5 MG PO TABS: 10.0000 mg | ORAL_TABLET | ORAL | Status: DC | PRN

## 2018-08-05 MED ORDER — GABAPENTIN 100 MG PO CAPS
100.0000 mg | ORAL_CAPSULE | Freq: Every day | ORAL | Status: DC
  Administered 2018-08-05: 100 mg via ORAL
  Filled 2018-08-05: qty 1

## 2018-08-05 MED ORDER — MENTHOL 3 MG MT LOZG
1.0000 | LOZENGE | OROMUCOSAL | Status: DC | PRN
  Filled 2018-08-05 (×2): qty 9

## 2018-08-05 MED ORDER — HYDROCHLOROTHIAZIDE 12.5 MG PO CAPS
12.5000 mg | ORAL_CAPSULE | Freq: Every day | ORAL | Status: DC
  Administered 2018-08-06: 12.5 mg via ORAL
  Filled 2018-08-05: qty 1

## 2018-08-05 MED ORDER — LORATADINE 10 MG PO TABS
10.0000 mg | ORAL_TABLET | Freq: Every day | ORAL | Status: DC
  Administered 2018-08-06: 10 mg via ORAL
  Filled 2018-08-05: qty 1

## 2018-08-05 MED ORDER — METHOCARBAMOL 1000 MG/10ML IJ SOLN
500.0000 mg | Freq: Four times a day (QID) | INTRAVENOUS | Status: DC | PRN
  Filled 2018-08-05: qty 5

## 2018-08-05 MED ORDER — PROMETHAZINE HCL 25 MG/ML IJ SOLN: 6.2500 mg | INTRAMUSCULAR | Status: DC | PRN

## 2018-08-05 MED ORDER — METHOCARBAMOL 500 MG PO TABS
500.0000 mg | ORAL_TABLET | Freq: Four times a day (QID) | ORAL | Status: DC | PRN
  Administered 2018-08-05: 500 mg via ORAL
  Filled 2018-08-05: qty 1

## 2018-08-05 MED ORDER — SODIUM CHLORIDE 0.9 % IV SOLN
INTRAVENOUS | Status: DC
  Administered 2018-08-05: 16:00:00 via INTRAVENOUS

## 2018-08-05 MED ORDER — SODIUM CHLORIDE 0.9 % IV SOLN
INTRAVENOUS | Status: DC | PRN
  Administered 2018-08-05: 50 ug/min via INTRAVENOUS

## 2018-08-05 MED ORDER — LIDOCAINE HCL (PF) 2 % IJ SOLN
INTRAMUSCULAR | Status: AC
  Filled 2018-08-05: qty 10

## 2018-08-05 MED ORDER — ACETAMINOPHEN 325 MG PO TABS: 650.0000 mg | ORAL_TABLET | ORAL | Status: DC | PRN

## 2018-08-05 MED ORDER — PHENYLEPHRINE HCL 10 MG/ML IJ SOLN
INTRAMUSCULAR | Status: DC | PRN
  Administered 2018-08-05: 200 ug via INTRAVENOUS
  Administered 2018-08-05 (×2): 100 ug via INTRAVENOUS
  Administered 2018-08-05: 200 ug via INTRAVENOUS
  Administered 2018-08-05: 100 ug via INTRAVENOUS

## 2018-08-05 MED ORDER — LIDOCAINE-EPINEPHRINE (PF) 1 %-1:200000 IJ SOLN
INTRAMUSCULAR | Status: AC
  Filled 2018-08-05: qty 30

## 2018-08-05 MED ORDER — ONDANSETRON HCL 4 MG/2ML IJ SOLN
INTRAMUSCULAR | Status: AC
  Filled 2018-08-05: qty 2

## 2018-08-05 MED ORDER — SODIUM CHLORIDE 0.9 % IV SOLN: 250.0000 mL | INTRAVENOUS | Status: DC

## 2018-08-05 MED ORDER — SODIUM CHLORIDE 0.9% FLUSH: 3.0000 mL | Freq: Two times a day (BID) | INTRAVENOUS | Status: DC

## 2018-08-05 SURGICAL SUPPLY — 64 items
BLADE BOVIE TIP EXT 4 (BLADE) ×3 IMPLANT
BLADE SURG 15 STRL LF DISP TIS (BLADE) ×1 IMPLANT
BLADE SURG 15 STRL SS (BLADE) ×2
BONE WEDGE CONERSTONE 6X14X11 (Bone Implant) ×6 IMPLANT
BUR DIAMOND COARSE 4.0 RND (BURR) ×3 IMPLANT
BUR NEURO DRILL SOFT 3.0X3.8M (BURR) ×3 IMPLANT
CANISTER SUCT 1200ML W/VALVE (MISCELLANEOUS) ×6 IMPLANT
CHLORAPREP W/TINT 26ML (MISCELLANEOUS) ×3 IMPLANT
COLLAR CERV MED MED DENS 3 (SOFTGOODS) IMPLANT
COLLAR CERV SM MED DENS 3 (SOFTGOODS) IMPLANT
COLLAR CERV XL MED DENS 3 (SOFTGOODS) IMPLANT
COUNTER NEEDLE 20/40 LG (NEEDLE) ×3 IMPLANT
COVER LIGHT HANDLE STERIS (MISCELLANEOUS) ×6 IMPLANT
CRADLE LAMINECT ARM (MISCELLANEOUS) ×3 IMPLANT
CUP MEDICINE 2OZ PLAST GRAD ST (MISCELLANEOUS) ×6 IMPLANT
DERMABOND ADVANCED (GAUZE/BANDAGES/DRESSINGS) ×2
DERMABOND ADVANCED .7 DNX12 (GAUZE/BANDAGES/DRESSINGS) ×1 IMPLANT
DRAPE C-ARM 42X72 X-RAY (DRAPES) ×3 IMPLANT
DRAPE INCISE IOBAN 66X45 STRL (DRAPES) IMPLANT
DRAPE MICROSCOPE SPINE 48X150 (DRAPES) ×3 IMPLANT
DRAPE POUCH INSTRU U-SHP 10X18 (DRAPES) IMPLANT
DRAPE SHEET LG 3/4 BI-LAMINATE (DRAPES) IMPLANT
DRAPE SURG 17X11 SM STRL (DRAPES) ×12 IMPLANT
DRAPE TABLE BACK 80X90 (DRAPES) IMPLANT
DRAPE THYROID T SHEET (DRAPES) ×3 IMPLANT
ELECT CAUTERY BLADE TIP 2.5 (TIP) ×3
ELECT EZSTD 165MM 6.5IN (MISCELLANEOUS) ×3
ELECTRODE CAUTERY BLDE TIP 2.5 (TIP) ×1 IMPLANT
ELECTRODE EZSTD 165MM 6.5IN (MISCELLANEOUS) ×1 IMPLANT
FEE INTRAOP MONITOR IMPULS NCS (MISCELLANEOUS) ×1 IMPLANT
GLOVE BIOGEL PI IND STRL 8 (GLOVE) ×1 IMPLANT
GLOVE BIOGEL PI INDICATOR 8 (GLOVE) ×2
GLOVE SURG SYN 8.0 (GLOVE) ×6 IMPLANT
GOWN STRL REUS W/ TWL LRG LVL3 (GOWN DISPOSABLE) ×1 IMPLANT
GOWN STRL REUS W/ TWL XL LVL3 (GOWN DISPOSABLE) ×1 IMPLANT
GOWN STRL REUS W/TWL LRG LVL3 (GOWN DISPOSABLE) ×2
GOWN STRL REUS W/TWL XL LVL3 (GOWN DISPOSABLE) ×2
GRADUATE 1200CC STRL 31836 (MISCELLANEOUS) ×3 IMPLANT
INTRAOP MONITOR FEE IMPULS NCS (MISCELLANEOUS) ×1
INTRAOP MONITOR FEE IMPULSE (MISCELLANEOUS) ×2
IV CATH ANGIO 12GX3 LT BLUE (NEEDLE) ×3 IMPLANT
KIT TURNOVER KIT A (KITS) ×3 IMPLANT
MARKER SKIN DUAL TIP RULER LAB (MISCELLANEOUS) ×6 IMPLANT
NEEDLE HYPO 22GX1.5 SAFETY (NEEDLE) ×3 IMPLANT
NEEDLE SPNL 22GX3.5 QUINCKE BK (NEEDLE) ×3 IMPLANT
NS IRRIG 1000ML POUR BTL (IV SOLUTION) ×3 IMPLANT
PACK LAMINECTOMY NEURO (CUSTOM PROCEDURE TRAY) ×3 IMPLANT
PASTE BONE GRAFTON 1CC (Bone Implant) ×3 IMPLANT
PIN CASPAR 14 (PIN) ×1 IMPLANT
PIN CASPAR 14MM (PIN) ×3
PLATE ZEVO 2LVL 33MM (Plate) ×3 IMPLANT
SCREW 3.5 SELFTAP 15MM VARI (Screw) ×15 IMPLANT
SCREW 4.0 SELFTAP 15MM VARI (Screw) ×3 IMPLANT
SPOGE SURGIFLO 8M (HEMOSTASIS) ×2
SPONGE KITTNER 5P (MISCELLANEOUS) ×3 IMPLANT
SPONGE SURGIFLO 8M (HEMOSTASIS) ×1 IMPLANT
SUT VICRYL 2-0 SH 8X27 (SUTURE) ×3 IMPLANT
SUT VICRYL 3-0 CR8 SH (SUTURE) ×3 IMPLANT
SYR 30ML LL (SYRINGE) ×3 IMPLANT
TAPE CLOTH 3X10 WHT NS LF (GAUZE/BANDAGES/DRESSINGS) ×3 IMPLANT
TOWEL OR 17X26 4PK STRL BLUE (TOWEL DISPOSABLE) ×3 IMPLANT
TRAY FOLEY MTR SLVR 16FR STAT (SET/KITS/TRAYS/PACK) IMPLANT
TUBING CONNECTING 10 (TUBING) ×2 IMPLANT
TUBING CONNECTING 10' (TUBING) ×1

## 2018-08-05 NOTE — Interval H&P Note (Signed)
History and Physical Interval Note:  08/05/2018 10:08 AM  Dennis Davidson  has presented today for surgery, with the diagnosis of cervical radiculopathy  The various methods of treatment have been discussed with the patient and family. After consideration of risks, benefits and other options for treatment, the patient has consented to  Procedure(s): ANTERIOR CERVICAL DECOMPRESSION/DISCECTOMY FUSION 2 LEVELS-C4-5, C5-6 (N/A) as a surgical intervention .  The patient's history has been reviewed, patient examined, no change in status, stable for surgery.  I have reviewed the patient's chart and labs.  Questions were answered to the patient's satisfaction.     [name]

## 2018-08-05 NOTE — Anesthesia Preprocedure Evaluation (Signed)
Anesthesia Evaluation  Patient identified by MRN, date of birth, ID band Patient awake    Reviewed: Allergy & Precautions, H&P , NPO status , Patient's Chart, lab work & pertinent test results, reviewed documented beta blocker date and time   History of Anesthesia Complications Negative for: history of anesthetic complications  Airway Mallampati: II  TM Distance: >3 FB Neck ROM: full    Dental  (+) Dental Advidsory Given, Missing, Teeth Intact   Pulmonary neg pulmonary ROS, former smoker,           Cardiovascular Exercise Tolerance: Good hypertension, (-) angina+ CAD  (-) Past MI, (-) Cardiac Stents and (-) CABG + dysrhythmias (-) Valvular Problems/Murmurs     Neuro/Psych PSYCHIATRIC DISORDERS Anxiety Depression negative neurological ROS     GI/Hepatic Neg liver ROS, GERD  ,  Endo/Other  negative endocrine ROS  Renal/GU negative Renal ROS  negative genitourinary   Musculoskeletal   Abdominal   Peds  Hematology negative hematology ROS (+)   Anesthesia Other Findings Past Medical History: No date: Headache     Comment:  secondary to bulging disc No date: Hypertension   Reproductive/Obstetrics negative OB ROS                             Anesthesia Physical Anesthesia Plan  ASA: II  Anesthesia Plan: General   Post-op Pain Management:    Induction: Intravenous  PONV Risk Score and Plan: 2 and Ondansetron, Dexamethasone, Treatment may vary due to age or medical condition and Promethazine  Airway Management Planned: Oral ETT  Additional Equipment:   Intra-op Plan:   Post-operative Plan: Extubation in OR  Informed Consent: I have reviewed the patients History and Physical, chart, labs and discussed the procedure including the risks, benefits and alternatives for the proposed anesthesia with the patient or authorized representative who has indicated his/her understanding and  acceptance.   Dental Advisory Given  Plan Discussed with: Anesthesiologist, CRNA and Surgeon  Anesthesia Plan Comments:         Anesthesia Quick Evaluation

## 2018-08-05 NOTE — Op Note (Signed)
Operative Note 08/05/2018  PRE-OP DIAGNOSIS:  Cervical myelopathy[M50.01] Herniated nucleus pulposus, cervical [M50.20]   POST-OP DIAGNOSIS:Post-Op Diagnosis Codes: * Cervical myelopathy[M50.01] * Herniated nucleus pulposus, cervical [M50.20]   Procedure(s): 1.ARTHRODESIS, ANTERIOR INTERBODY, INCLUDING DISC SPACE PREPARATION, DISCECTOMY, OSTEOPHYTECTOMY AND DECOMPRESSION OF SPINAL CORD AND/ORNERVE ROOTS; CERVICAL BELOW C2--C5-6ACDF  2.ARTHRODESIS ANTERIOR INTERBODY W/DISCECTOMY ADDITIONALLEVEL C4-5 3.ANTERIOR INSTRUMENTATION; 2 TO 3 VERTEBRAL SEGMENTS (LIST IN ADDITION TO PRIMARY PROCEDURE) 4.ALLOGRAFT, STRUCTURAL, FOR SPINE SURGERY ONLY (LIST IN ADDITION TO PRIMARY PROCEDURE)  SURGEON: Surgeon(s) and Role: * Nathaniel Man, MD - Primary  Ivar Drape, [place], Asisstant  ANESTHESIA:General   OPERATIVE FINDINGS: Stenosis atC5/6and C4/5  OPERATIVE REPORT:   Indications Mr.Hayespresented to our clinic on8/27 with ongoing numbness and weakness in left arm.  He had a MRI that showed disc herniation causing severe stenosis at C5/6and C4/5.Given this, we recommended an anterior cervical decompression and fusion to relieve the pressure on the spinal cord and allow for healing.The risks of hematoma, infection, poor bone healing and failure of fusion, cord injury, weakness, numbness, neck pain, stroke, and death were discussed in detail. All questions were answered and the patient elected to proceed with the surgery. We discussed using monitoring to decrease risk of spinal cord injury.  Procedure After obtaining informed consent, the patient was taken to the Operating Room where general anesthesia was induced and the patient intubated. Vascular access was obtained. Decadronand antibioticswereadministered. The head was slightly extended and imaging used to identify a skin crease overlying the C5vertebral body. Appropriate padding was performed.  Neuromonitoring electrodes were placed for MEP and SSEP baselines were obtained.   The patient was prepped and draped in the usual sterile fashion and a timeout was performed per protocol. Local anesthesia was instilled with epinephrine along the planned incision site. A transverse cervicalincision was performed on the left in a skin crease. The incision was carried to the level of the platysma and then cautery was used to incise the muscle. Blunt dissection was used to expand the plane and the dissection was carried deep medial to the SCM and carotid sheath being careful to identify the trachea and esophagus medially. The prevertebral fascia was identified and this was bluntly dissected to expose the disc spaces. A needle was placed in the disc space and x-ray confirmed the C4/5disc level.   Next, cautery was used to undermine the longus colli muscles bilaterally and identify the C5/6and C4/5disc space. Caspar pins were placed at C4and C6and a retractor system placed under the muscles to complete the exposure. Next, a combination of curettes and Kerrison rongeurs were used to remove the anterior osteophyte at C4/5and then the disc material. A 3mm matchstick was used to shave the endplates of the adjacent bodies. A trial spacer was used to size the graft and then hemostasis obtained.  Next, the retractors were moved to the C5/6 disc space.Next, a combination of curettes and Kerrison rongeurs were used to remove the anterior osteophyte at C5/6and then the disc material. This level was seen to have severely collapsed disc space. A 3mm matchstick was used to shave the endplates of the adjacent bodies. A trial spacer was used to size the graft and then hemostasis obtained.  The microscope was brought into the field for the remainder of the surgery. The matchsitickdrill bitwas used to remove the osteophyte/disc complex deep to the level of the PLL at C4/5. There was disc protrusion at this  level that was removed with rongeurs.There was a large disc fragment on the right.The PLL  was entered with a hook and then the PLL was removed along with remaining disc material to decompress centrally and then out into bilateral neuro foramen. A blunt probe was used to confirm no residual stenosis laterally and a curette used to ensure no posterior osteophyte remained. Floseal was used for hemostasis. Allograft was placed, 89mm in height and placed slightly recessed to theanterior edge of vertebral bodyto promote arthrodesis.  Next, the C5/6 disc space was started. The matchsitickdrill bitwas used to remove the osteophyte/disc complex deep to the level of the PLL at C5/6. There was disc protrusion at this level with a significant osteophyte causing compression that was removed with rongeurs.The PLL was entered with a hook and then the PLL was removed along with remaining disc material to decompress centrally and then out into bilateral neuro foramen. A blunt probe was used to confirm no residual stenosis laterally and a curette used to ensure no posterior osteophyte remained. Floseal was used for hemostasis. Allograft was placed, 32mm in height and placed slightly recessed to theanterior edge of vertebral bodyto promote arthrodesis.  Motor evoked potentials and SSEPs were checked throughout the case and found to be stable.  The caspar pins were removed and bone wax placed. The remainder of the osteophytes were removedto allow for plating. Next, atwo level plate was found to be the adequate size and was placed in the midline and secured with two 18mm screws at each bone level. Xray was obtained confirming good graft placement and adequate depth of screws. The retractors were removed. The wound was irrigated copiously and hemostasis obtained. The platysma was closed with 2-0 vicryl suture. The dermis was closed with 3-0 Vicryl and Dermabond was placed on the skin.  The patient had general  anesthesia reversed and was extubated following the procedure. He awoke following commands with symmetric movement. He was taken to the PACU where he continued his recovery and then the ward.   ESTIMATED BLOOD LOSS:20cc   SPECIMENS:None    IMPLANT Medtronic 35mm screws x6 Medtronic Zevo Anterior Plate [id number] Lordotic Spacerx2 Medtronic Putty [phone])  ATTESTATION: I performed the procedure in its entirety with the assistance of Ivar Drape, physician assistant  [name], MD

## 2018-08-05 NOTE — H&P (Signed)
Dennis Davidson is an 42 y.o. male.   Chief Complaint: Left arm pain, numbness, weakness  HPI: Dennis Davidson presented on 8/27 to clinic with ongoing symptoms of neck pain and arm pain with numbness and weakness. This started many years ago and has progressively worsened. He describes the neck pain as posterior going into the head and bilateral shoulders. He had some right shoulder pain and this is improving. He has noticed some muscle loss on the left and notices his left hand is weaker. He is right handed. He has had pain shooting down the left arm into the hand and this is associated with numbness. He has tried physical therapy, steroid injections, and pain medications without any significant relief. He has been taking gabapentin and tramadol. He currently has been on more of a desk job but this is still difficult given the symptoms. He does note some difficulty with balance   Past Medical History:  Diagnosis Date  . Headache    secondary to bulging disc  . Hypertension     Past Surgical History:  Procedure Laterality Date  . CARDIAC CATHETERIZATION Left 09/07/2016   Procedure: Left Heart Cath and Coronary Angiography;  Surgeon: Yolonda Kida, MD;  Location: [facility];  Service: Cardiovascular;  Laterality: Left;  . MOUTH SURGERY      History reviewed. No pertinent family history. Social History:  reports that he quit smoking about 10 years ago. He has never used smokeless tobacco. He reports that he drinks alcohol. He reports that he does not use drugs.  Allergies: No Known Allergies  Medications Prior to Admission  Medication Sig Dispense Refill  . ALPRAZolam (XANAX) 0.5 MG tablet Take 0.5 mg by mouth 3 (three) times Davidson as needed.    Marland Kitchen atorvastatin (LIPITOR) 80 MG tablet Take 80 mg by mouth every evening.    . cetirizine (ZYRTEC) 10 MG tablet Take 10 mg by mouth Davidson as needed for allergies.    . cholecalciferol (VITAMIN D) 1000 units tablet Take 1,000 Units by mouth  Davidson.    . cyclobenzaprine (FLEXERIL) 10 MG tablet Take 1 tablet (10 mg total) by mouth 3 (three) times Davidson as needed for muscle spasms. 30 tablet 0  . escitalopram (LEXAPRO) 20 MG tablet Take 20 mg by mouth Davidson.    Marland Kitchen gabapentin (NEURONTIN) 100 MG capsule Take 100 mg by mouth at bedtime.    Marland Kitchen lisinopril-hydrochlorothiazide (PRINZIDE,ZESTORETIC) 10-12.5 MG tablet TAKE ONE TABLET EVERY DAY 90 tablet 3  . metoprolol tartrate (LOPRESSOR) 25 MG tablet Take 25 mg by mouth 2 (two) times Davidson.      Results for orders placed or performed during the hospital encounter of 08/05/18 (from the past 48 hour(s))  Protime-INR     Status: Abnormal   Collection Time: 08/05/18  8:49 AM  Result Value Ref Range   Prothrombin Time 11.1 (L) 11.4 - 15.2 seconds   INR 0.81     Comment: Performed at [facility], [address] [place], [place] [address]  APTT     Status: None   Collection Time: 08/05/18  8:49 AM  Result Value Ref Range   aPTT 26 24 - 36 seconds    Comment: Performed at [facility], [address] [place], [place] [address]  CBC with Differential/Platelet     Status: Abnormal   Collection Time: 08/05/18  8:49 AM  Result Value Ref Range   WBC 7.2 3.8 - 10.6 K/uL   RBC 4.24 (L)  4.40 - 5.90 MIL/uL   Hemoglobin 14.7 13.0 - 18.0 g/dL   HCT 41.2 40.0 - 52.0 %   MCV 97.0 80.0 - 100.0 fL   MCH 34.6 (H) 26.0 - 34.0 pg   MCHC 35.6 32.0 - 36.0 g/dL   RDW 14.3 11.5 - 14.5 %   Platelets 123 (L) 150 - 440 K/uL   Neutrophils Relative % 75 %   Neutro Abs 5.3 1.4 - 6.5 K/uL   Lymphocytes Relative 18 %   Lymphs Abs 1.3 1.0 - 3.6 K/uL   Monocytes Relative 5 %   Monocytes Absolute 0.4 0.2 - 1.0 K/uL   Eosinophils Relative 2 %   Eosinophils Absolute 0.2 0 - 0.7 K/uL   Basophils Relative 0 %   Basophils Absolute 0.0 0 - 0.1 K/uL    Comment: Performed at [facility], [address] [place], [place] [address]  Basic metabolic panel     Status: Abnormal   Collection  Time: 08/05/18  8:49 AM  Result Value Ref Range   Sodium 136 135 - 145 mmol/L   Potassium 3.4 (L) 3.5 - 5.1 mmol/L   Chloride 97 (L) 98 - 111 mmol/L   CO2 27 22 - 32 mmol/L   Glucose, Bld 109 (H) 70 - 99 mg/dL   BUN 8 6 - 20 mg/dL   Creatinine, Ser 0.76 0.61 - 1.24 mg/dL   Calcium 9.6 8.9 - 10.3 mg/dL   GFR calc non Af Amer >60 >60 mL/min   GFR calc Af Amer >60 >60 mL/min    Comment: (NOTE) The eGFR has been calculated using the CKD EPI equation. This calculation has not been validated in all clinical situations. eGFR's persistently <60 mL/min signify possible Chronic Kidney Disease.    Anion gap 12 5 - 15    Comment: Performed at [facility], [address] [place], [place] [address]  ABO/Rh     Status: None   Collection Time: 08/05/18  8:49 AM  Result Value Ref Range   ABO/RH(D)      A POS Performed at [facility], [address] [place], [place] [address]   Type and screen [place]     Status: None   Collection Time: 08/05/18  8:53 AM  Result Value Ref Range   ABO/RH(D) A POS    Antibody Screen NEG    Sample Expiration      08/08/2018 Performed at [facility], [address] [place], [place] [address]   Urinalysis, Routine w reflex microscopic     Status: Abnormal   Collection Time: 08/05/18  9:27 AM  Result Value Ref Range   Color, Urine YELLOW (A) YELLOW   APPearance HAZY (A) CLEAR   Specific Gravity, Urine 1.015 1.005 - 1.030   pH 7.0 5.0 - 8.0   Glucose, UA NEGATIVE NEGATIVE mg/dL   Hgb urine dipstick NEGATIVE NEGATIVE   Bilirubin Urine NEGATIVE NEGATIVE   Ketones, ur NEGATIVE NEGATIVE mg/dL   Protein, ur NEGATIVE NEGATIVE mg/dL   Nitrite NEGATIVE NEGATIVE   Leukocytes, UA NEGATIVE NEGATIVE    Comment: Performed at [facility], [address] [place], [place] [address]   X-ray Chest Pa Or Ap  Result Date: 08/05/2018 CLINICAL DATA:  Preoperative exam. EXAM: CHEST  1 VIEW COMPARISON:   02/17/2010 FINDINGS: Cardiomediastinal silhouette is normal. Mediastinal contours appear intact. There is no evidence of focal airspace consolidation, pleural effusion or pneumothorax. Nodular density overlies the lower right hemithorax. Osseous  structures are without acute abnormality. Soft tissues are grossly normal. IMPRESSION: Nodular density overlies the lower right hemithorax. This may represent a nipple shadow or a pulmonary nodule. Repeated PA and lateral chest radiograph with nipple markers in place may be considered. Otherwise normal appearance of the chest. Electronically Signed   By: Fidela Salisbury M.D.   On: 08/05/2018 08:51    ROS General ROS: Negative Psychological ROS: Negative Ophthalmic ROS: Negative ENT ROS: Negative Hematological and Lymphatic ROS: Negative  Endocrine ROS: Negative Respiratory ROS: Negative Cardiovascular ROS: Negative Gastrointestinal ROS: Negative Genito-Urinary ROS: Negative Musculoskeletal ROS: Positive for neck pain Neurological ROS: Positive for arm pain, numbness, weakness Dermatological ROS: Negative  Pulse (!) 103, temperature 97.9 F (36.6 C), temperature source Tympanic, resp. rate 20, height 5' 7" (1.702 m), weight 74.8 kg, SpO2 100 %. Physical Exam  General appearance: Alert, cooperative, in no acute distress Head: Normocephalic, atraumatic Eyes: Normal, EOM intact Oropharynx: Moist without lesions Neck: FROM, increased pain with extension and flexion Ext: No edema in LE bilaterally, warm extremities  Neurologic exam:  Mental status: alertness: alert, affect: normal Speech: fluent and clear Motor: Strength 5/5 in bilateral deltoids, triceps, interossei. 5/5 right biceps and wrist extension. 4/5 left wrist extension and biceps Sensory: decrease in bilateral hands, left greater than right. Also decrease in left forearm Reflexes: 2+ and symmetric bilaterally for biceps and patella Gait: normal   Imaging: MRI Cervical Spine: 1.  Leftward disc osteophyte complex at C5-6 with moderate left central canal stenosis, severe left foraminal stenosis, and moderate right foraminal stenosis. 2. Moderate central and bilateral foraminal narrowing at C4-5 has progressed slightly. 3. Mild left central and right foraminal narrowing at C3-4 has slightly progressed.   Assessment/Plan Cervical myelopathy and left C6 radiculopathy with right C5 radiculopathy  Plan for C4-6 ACDF  Deetta Perla, MD 08/05/2018, 10:05 AM

## 2018-08-05 NOTE — Anesthesia Procedure Notes (Signed)
Procedure Name: Intubation Date/Time: 08/05/2018 10:40 AM Performed by: Eben Burow, CRNA Pre-anesthesia Checklist: Patient identified, Emergency Drugs available, Suction available, Patient being monitored and Timeout performed Patient Re-evaluated:Patient Re-evaluated prior to induction Oxygen Delivery Method: Circle system utilized Preoxygenation: Pre-oxygenation with 100% oxygen Induction Type: IV induction Ventilation: Mask ventilation without difficulty Laryngoscope Size: McGraph and 4 Grade View: Grade I Tube type: Oral Tube size: 7.5 mm Number of attempts: 1 Airway Equipment and Method: LTA kit utilized,  Stylet and Video-laryngoscopy Placement Confirmation: positive ETCO2 and breath sounds checked- equal and bilateral Secured at: 23 cm Tube secured with: Tape Dental Injury: Teeth and Oropharynx as per pre-operative assessment

## 2018-08-05 NOTE — Anesthesia Post-op Follow-up Note (Signed)
Anesthesia QCDR form completed.        

## 2018-08-05 NOTE — Transfer of Care (Signed)
Immediate Anesthesia Transfer of Care Note  Patient: Dennis Davidson  Procedure(s) Performed: ANTERIOR CERVICAL DECOMPRESSION/DISCECTOMY FUSION 2 LEVELS-C4-5, C5-6 (N/A Neck)  Patient Location: PACU  Anesthesia Type:General  Level of Consciousness: awake, alert , oriented and patient cooperative  Airway & Oxygen Therapy: Patient Spontanous Breathing  Post-op Assessment: Report given to RN and Post -op Vital signs reviewed and stable  Post vital signs: Reviewed and stable  Last Vitals:  Vitals Value Taken Time  BP 128/85 08/05/2018  1:35 PM  Temp    Pulse 107 08/05/2018  1:38 PM  Resp 15 08/05/2018  1:38 PM  SpO2 97 % 08/05/2018  1:38 PM  Vitals shown include unvalidated device data.  Last Pain:  Vitals:   08/05/18 0849  TempSrc: Tympanic  PainSc:          Complications: No apparent anesthesia complications

## 2018-08-05 NOTE — Progress Notes (Signed)
Pharmacy consulted to dose Cefazolin for surgical prophylaxis in 42 yo male weighing 74.8 kg.  Ordered Cefazolin 1 gm once per protocol.  Joshue Haecker, RPh 08/05/2018 8:52 AM

## 2018-08-05 NOTE — Progress Notes (Signed)
Pt admitted to unit from PACU. Pt is A&Ox4, VSS.  Pt and his wife oriented to room, plan of care, and back precautions. Skin glue intact to incision, soft collar ordered. Pt is neuro intact with full sensation to all extremities, left arm/hand is weaker than right, but pt states this was baseline for him. Pt ambulated to bathroom with no difficulties.  [place], Latricia Heft

## 2018-08-05 NOTE — Progress Notes (Signed)
   08/05/18 2040  Clinical Encounter Type  Visited With Patient and family together  Visit Type Initial  Spiritual Encounters  Spiritual Needs Literature    Chaplain received an OR to complete or update an AD. Patient and wife watching TV upon arrival to the patient's room. Patient was very welcoming. He indicated that he was interested in having the information on the AD and needed a push to complete it; he confirmed that this is the perfect time to do so. Patient requested that the information be left for further review and will ask the nurse to call or page in the morning to complete before he is discharged. They then thanked the chaplain for stopping by.

## 2018-08-05 NOTE — Progress Notes (Signed)
IS education completed by RN. Pt understands technique and reason for use. Pt inspire 1250+ml. Pt independent with use

## 2018-08-05 NOTE — Progress Notes (Signed)
Procedure: C4-5, C5-6 Procedure Date: 08/05/2018 Diagnosis: Cervical myelopathy  History: Dennis Davidson is here for C4-5, C5-6 ACDF for cervical myelopathy. Tolerated procedure well. Seen in post op recovery still disoriented from anesthesia but able to obey commands and communicate. Complains of posterior neck discomfort. Otherwise, he denies upper extremity pain/numbness/tingling, trouble swallowing.   Physical Exam: Vitals:   08/05/18 0849 08/05/18 1337  Pulse: (!) 103   Resp: 20   Temp: 97.9 F (36.6 C) 97.7 F (36.5 C)  SpO2: 100%     AA Ox3 CNI Skin: incision site and glue intact Strength:5/5 throughout  Sensation: intact and symmetric  Data:  Recent Labs  Lab 08/05/18 0849  NA 136  K 3.4*  CL 97*  CO2 27  BUN 8  CREATININE 0.76  GLUCOSE 109*  CALCIUM 9.6   No results for input(s): AST, ALT, ALKPHOS in the last 168 hours.  Invalid input(s): TBILI   Recent Labs  Lab 08/05/18 0849  WBC 7.2  HGB 14.7  HCT 41.2  PLT 123*   Recent Labs  Lab 08/05/18 0849  APTT 26  INR 0.81         Other tests/results: No imaging reviewed.   Assessment/Plan:  Dennis Davidson is POD0 s/p C4-5, C5-6 ACDF for cervical myelopathy. Neuro intact. Will observe and continue pain control with tylenol, robaxin, and oxycodone. Soft collar for comfort.   - mobilize - pain control - DVT prophylaxis  Ivar Drape PA-C Department of Neurosurgery

## 2018-08-06 ENCOUNTER — Encounter: Payer: Self-pay | Admitting: Neurosurgery

## 2018-08-06 ENCOUNTER — Observation Stay: Payer: Managed Care, Other (non HMO)

## 2018-08-06 DIAGNOSIS — M5412 Radiculopathy, cervical region: Secondary | ICD-10-CM | POA: Diagnosis present

## 2018-08-06 DIAGNOSIS — Z79899 Other long term (current) drug therapy: Secondary | ICD-10-CM | POA: Diagnosis not present

## 2018-08-06 DIAGNOSIS — G992 Myelopathy in diseases classified elsewhere: Secondary | ICD-10-CM | POA: Diagnosis present

## 2018-08-06 DIAGNOSIS — Z87891 Personal history of nicotine dependence: Secondary | ICD-10-CM | POA: Diagnosis not present

## 2018-08-06 DIAGNOSIS — I10 Essential (primary) hypertension: Secondary | ICD-10-CM | POA: Diagnosis present

## 2018-08-06 DIAGNOSIS — M4802 Spinal stenosis, cervical region: Secondary | ICD-10-CM | POA: Diagnosis present

## 2018-08-06 DIAGNOSIS — Z23 Encounter for immunization: Secondary | ICD-10-CM | POA: Diagnosis not present

## 2018-08-06 DIAGNOSIS — R531 Weakness: Secondary | ICD-10-CM | POA: Diagnosis present

## 2018-08-06 MED ORDER — OXYCODONE HCL 5 MG PO TABS: 5.0000 mg | ORAL_TABLET | ORAL | 0 refills | Status: DC | PRN

## 2018-08-06 MED ORDER — METHOCARBAMOL 500 MG PO TABS: 500.0000 mg | ORAL_TABLET | Freq: Four times a day (QID) | ORAL | 0 refills | Status: DC | PRN

## 2018-08-06 NOTE — Progress Notes (Signed)
Pt ready for discharge home today per MD. Patient assessment unchanged from this morning. Pt ambulated around unit x2 with NT this morning and tolerated well.  Reviewed discharge instructions and prescriptions with pt and his wife; all questions answered and pt verbalized understanding. PIV removed, VSS. Pt assisted to car via NT.   Shelbie Ammons

## 2018-08-06 NOTE — Anesthesia Postprocedure Evaluation (Signed)
Anesthesia Post Note  Patient: Dennis Davidson  Procedure(s) Performed: ANTERIOR CERVICAL DECOMPRESSION/DISCECTOMY FUSION 2 LEVELS-C4-5, C5-6 (N/A Neck)  Patient location during evaluation: PACU Anesthesia Type: General Level of consciousness: awake and alert Pain management: pain level controlled Vital Signs Assessment: post-procedure vital signs reviewed and stable Respiratory status: spontaneous breathing, nonlabored ventilation, respiratory function stable and patient connected to nasal cannula oxygen Cardiovascular status: blood pressure returned to baseline and stable Postop Assessment: no apparent nausea or vomiting Anesthetic complications: no     Last Vitals:  Vitals:   08/06/18 0311 08/06/18 0809  BP: (!) 138/91 128/87  Pulse: 90 74  Resp: 16 18  Temp: 36.5 C 36.4 C  SpO2: 98% 100%    Last Pain:  Vitals:   08/06/18 0911  TempSrc:   PainSc: 3                  Lenard Simmer

## 2018-08-06 NOTE — Discharge Summary (Signed)
Procedure: C4-5, C5-6 Procedure Date: 08/05/2018 Diagnosis: Cervical myelopathy  History: POD1: Patient is POD1 s/p ACDF and doing well. Symptoms prior to surgery continue to improve, especially left arm weakness. He has ambulated, voided, and eaten without issue. No swallowing difficulties and able to tolerate solid diet.    POD0: Dennis Davidson is here for C4-5, C5-6 ACDF for cervical myelopathy. Tolerated procedure well. Seen in post op recovery still disoriented from anesthesia but able to obey commands and communicate. Complains of posterior neck discomfort. Otherwise, he denies upper extremity pain/numbness/tingling, trouble swallowing.   Physical Exam: Vitals:   08/06/18 0311 08/06/18 0809  BP: (!) 138/91 128/87  Pulse: 90 74  Resp: 16 18  Temp: 97.7 F (36.5 C) 97.6 F (36.4 C)  SpO2: 98% 100%    AA Ox3 CNI Skin: incision site and glue intact Strength:5/5 throughout, except left grip and bicep 4+/5 Sensation: intact and symmetric  Davidson:  Recent Labs  Lab 08/05/18 0849  NA 136  K 3.4*  CL 97*  CO2 27  BUN 8  CREATININE 0.76  GLUCOSE 109*  CALCIUM 9.6   No results for input(s): AST, ALT, ALKPHOS in the last 168 hours.  Invalid input(s): TBILI   Recent Labs  Lab 08/05/18 0849  WBC 7.2  HGB 14.7  HCT 41.2  PLT 123*   Recent Labs  Lab 08/05/18 0849  APTT 26  INR 0.81         Other tests/results:  EXAM: CERVICAL SPINE - 2-3 VIEW  COMPARISON:  Lateral view of the cervical spine 08/05/2018.  FINDINGS: Anterior plate and screws and interbody spaces are in place from C4-C6. Hardware is intact and appears appropriately position. Gas in the prevertebral soft tissues is consistent with postoperative change. No acute abnormality is identified. Straightening of cervical lordosis is noted.  IMPRESSION: Status post C4-6 ACDF.  No acute finding.   Electronically Signed   By: Drusilla Kanner M.D.   On: 08/06/2018  07:33 Assessment/Plan:  Dennis Davidson is POD1  s/p C4-5, C5-6 ACDF for cervical myelopathy. Neuro intact and left arm weakness has significantly improved following procedure. Denies any balance issue while ambulating short distances at this time. Will continue pain control with tylenol, robaxin, and oxycodone. Continue soft collar for comfort. Discussed physical activity restrictions and wound care. He will follow up in 2 weeks to monitor progress. All questions and concerns addressed. Advised to contact office is any additional questions or concerns arise.   Ivar Drape PA-C Department of Neurosurgery

## 2018-08-06 NOTE — Discharge Instructions (Signed)

## 2018-08-30 ENCOUNTER — Telehealth: Payer: Self-pay

## 2018-08-30 NOTE — Telephone Encounter (Signed)
Please advise the pharmacy to forward this to the patient's primary care provider for refills.

## 2018-08-30 NOTE — Telephone Encounter (Signed)
Received fax requesting refill for lisinopril-HCTZ 10-12.5 mg  Last OV (with [place])- 07/31/17 Last Lab- 08/05/18  Has active PCP

## 2018-08-30 NOTE — Telephone Encounter (Signed)
Pharmacy advised  

## 2019-04-24 DIAGNOSIS — M545 Low back pain, unspecified: Secondary | ICD-10-CM | POA: Insufficient documentation

## 2019-04-24 DIAGNOSIS — M25552 Pain in left hip: Secondary | ICD-10-CM | POA: Insufficient documentation

## 2019-05-07 ENCOUNTER — Other Ambulatory Visit: Payer: Self-pay

## 2019-05-07 ENCOUNTER — Encounter: Payer: Self-pay | Admitting: Emergency Medicine

## 2019-05-07 ENCOUNTER — Emergency Department
Admission: EM | Admit: 2019-05-07 | Discharge: 2019-05-07 | Disposition: A | Discharge status: Home / Self Care | Payer: Managed Care, Other (non HMO) | Attending: Emergency Medicine | Admitting: Emergency Medicine

## 2019-05-07 DIAGNOSIS — I2510 Atherosclerotic heart disease of native coronary artery without angina pectoris: Secondary | ICD-10-CM | POA: Diagnosis not present

## 2019-05-07 DIAGNOSIS — I10 Essential (primary) hypertension: Secondary | ICD-10-CM | POA: Diagnosis not present

## 2019-05-07 DIAGNOSIS — E860 Dehydration: Secondary | ICD-10-CM | POA: Insufficient documentation

## 2019-05-07 DIAGNOSIS — T675XXA Heat exhaustion, unspecified, initial encounter: Secondary | ICD-10-CM | POA: Diagnosis not present

## 2019-05-07 DIAGNOSIS — Z87891 Personal history of nicotine dependence: Secondary | ICD-10-CM | POA: Diagnosis not present

## 2019-05-07 DIAGNOSIS — Z79899 Other long term (current) drug therapy: Secondary | ICD-10-CM | POA: Insufficient documentation

## 2019-05-07 DIAGNOSIS — R531 Weakness: Secondary | ICD-10-CM | POA: Diagnosis present

## 2019-05-07 LAB — BASIC METABOLIC PANEL
Anion gap: 14 (ref 5–15)
BUN: 14 mg/dL (ref 6–20)
CO2: 24 mmol/L (ref 22–32)
Calcium: 9.3 mg/dL (ref 8.9–10.3)
Chloride: 96 mmol/L — ABNORMAL LOW (ref 98–111)
Creatinine, Ser: 0.85 mg/dL (ref 0.61–1.24)
GFR calc Af Amer: >60 mL/min (ref >60)
GFR calc non Af Amer: >60 mL/min (ref >60)
Glucose, Bld: 97 mg/dL (ref 70–99)
Potassium: 3.6 mmol/L (ref 3.5–5.1)
Sodium: 134 mmol/L — ABNORMAL LOW (ref 135–145)

## 2019-05-07 LAB — HEPATIC FUNCTION PANEL
ALT: 22 U/L (ref 0–44)
AST: 17 U/L (ref 15–41)
Albumin: 4 g/dL (ref 3.5–5.0)
Alkaline Phosphatase: 61 U/L (ref 38–126)
Bilirubin, Direct: 0.1 mg/dL (ref 0.0–0.2)
Indirect Bilirubin: 0.9 mg/dL (ref 0.3–0.9)
Total Bilirubin: 1 mg/dL (ref 0.3–1.2)
Total Protein: 6.9 g/dL (ref 6.5–8.1)

## 2019-05-07 LAB — CBC
HCT: 36.2 % — ABNORMAL LOW (ref 39.0–52.0)
Hemoglobin: 12.4 g/dL — ABNORMAL LOW (ref 13.0–17.0)
MCH: 35.4 pg — ABNORMAL HIGH (ref 26.0–34.0)
MCHC: 34.3 g/dL (ref 30.0–36.0)
MCV: 103.4 fL — ABNORMAL HIGH (ref 80.0–100.0)
Platelets: 151 10*3/uL (ref 150–400)
RBC: 3.5 MIL/uL — ABNORMAL LOW (ref 4.22–5.81)
RDW: 15.5 % (ref 11.5–15.5)
WBC: 11.5 10*3/uL — ABNORMAL HIGH (ref 4.0–10.5)
nRBC: 0 % (ref 0.0–0.2)

## 2019-05-07 LAB — ETHANOL: Alcohol, Ethyl (B): <10 mg/dL (ref <10)

## 2019-05-07 LAB — TROPONIN I: Troponin I: <0.03 ng/mL (ref <0.03)

## 2019-05-07 MED ORDER — SODIUM CHLORIDE 0.9 % IV BOLUS
1000.0000 mL | Freq: Once | INTRAVENOUS | Status: AC
  Administered 2019-05-07: 17:00:00 1000 mL via INTRAVENOUS

## 2019-05-07 MED ORDER — CYCLOBENZAPRINE HCL 10 MG PO TABS: 10.0000 mg | ORAL_TABLET | Freq: Three times a day (TID) | ORAL | 0 refills | Status: DC | PRN

## 2019-05-07 MED ORDER — CYCLOBENZAPRINE HCL 10 MG PO TABS
10.0000 mg | ORAL_TABLET | Freq: Once | ORAL | Status: AC
  Administered 2019-05-07: 20:00:00 10 mg via ORAL
  Filled 2019-05-07: qty 1

## 2019-05-07 NOTE — ED Notes (Signed)
Patient is complaining of back spasms, MD aware.

## 2019-05-07 NOTE — ED Triage Notes (Signed)
Pt states has pain to his left knee and hip and sees an orthopedic MD for it but continues to have pain. Pt also reports he was mowing the yard today and he felt real dizzy and confused. Pt states once back into a cool area he was fine. Pt denies sx's now. Pt states this happened an hour ago.

## 2019-05-07 NOTE — ED Provider Notes (Signed)
[facility] Emergency Department Provider Note  Time seen: 5:10 PM  I have reviewed the triage vital signs and the nursing notes.   HISTORY  Chief Complaint Knee Pain and Hip Pain   HPI Dennis Davidson is a 43 y.o. male with a past medical history of hypertension, osteoarthritis, spinal fusion, hyperlipidemia, presents to the emergency department for weakness and confusion.  According to the patient he was mowing his lawn this morning, states he believes he overexerted himself and became somewhat dizzy and confused.  Patient states he was able to cool down and felt quite a bit better after that.   Patient denies any fever, cough, congestion, shortness of breath.  Denies any chest pain at any point.  Denies any increased weakness or numbness does state mild left-sided weakness since his spinal fusion but this is chronic.  Denies any abdominal pain nausea vomiting or diarrhea.  Patient has noted a slight amount of decreased urination but denies any dysuria or hematuria.  Past Medical History:  Diagnosis Date  . Headache    secondary to bulging disc  . Hypertension     Patient Active Problem List   Diagnosis Date Noted  . Cervical myelopathy (HCC) 08/05/2018  . Arrhythmia 06/20/2017  . CAD in native artery 12/01/2016  . HTN, goal below 140/80 12/01/2016  . Mixed hyperlipidemia 12/01/2016  . Situational mixed anxiety and depressive disorder 12/01/2016    Past Surgical History:  Procedure Laterality Date  . ANTERIOR CERVICAL DECOMP/DISCECTOMY FUSION N/A 08/05/2018   Procedure: ANTERIOR CERVICAL DECOMPRESSION/DISCECTOMY FUSION 2 LEVELS-C4-5, C5-6;  Surgeon: Lucy Chrisook, Steven, MD;  Location: [facility] ORS;  Service: Neurosurgery;  Laterality: N/A;  . CARDIAC CATHETERIZATION Left 09/07/2016   Procedure: Left Heart Cath and Coronary Angiography;  Surgeon: Alwyn Peawayne D Callwood, MD;  Location: [facility] INVASIVE CV LAB;  Service: Cardiovascular;  Laterality: Left;  . MOUTH SURGERY       Prior to Admission medications   Medication Sig Start Date End Date Taking? Authorizing Provider  ALPRAZolam Prudy Feeler(XANAX) 0.5 MG tablet Take 0.5 mg by mouth 3 (three) times daily as needed.    [provider]  atorvastatin (LIPITOR) 80 MG tablet Take 80 mg by mouth every evening. 02/07/17   [provider]  cetirizine (ZYRTEC) 10 MG tablet Take 10 mg by mouth daily as needed for allergies.    [provider]  cholecalciferol (VITAMIN D) 1000 units tablet Take 1,000 Units by mouth daily.    [provider]  escitalopram (LEXAPRO) 20 MG tablet Take 20 mg by mouth daily. 11/13/16   [provider]  gabapentin (NEURONTIN) 100 MG capsule Take 100 mg by mouth at bedtime.    [provider]  lisinopril-hydrochlorothiazide (PRINZIDE,ZESTORETIC) 10-12.5 MG tablet TAKE ONE TABLET EVERY DAY 08/20/17   Sherrie MustacheFisher, Roselyn BeringSusan W, PA-C  methocarbamol (ROBAXIN) 500 MG tablet Take 1 tablet (500 mg total) by mouth every 6 (six) hours as needed for muscle spasms. 08/06/18   Ivar DrapeFerri, Amanda, PA-C  metoprolol tartrate (LOPRESSOR) 25 MG tablet Take 25 mg by mouth 2 (two) times daily.    [provider]  oxyCODONE (OXY IR/ROXICODONE) 5 MG immediate release tablet Take 1 tablet (5 mg total) by mouth every 3 (three) hours as needed for moderate pain ((score 4 to 6)). 08/06/18   Ivar DrapeFerri, Amanda, PA-C    No Known Allergies  No family history on file.  Social History Social History   Tobacco Use  . Smoking status: Former Smoker    Last  attempt to quit: 12/08/2007    Years since quitting: 11.4  . Smokeless tobacco: Never Used  Substance Use Topics  . Alcohol use: Yes    Alcohol/week: 0.0 standard drinks    Comment: 1-2 month  . Drug use: No    Review of Systems Constitutional: Negative for fever. Cardiovascular: Negative for chest pain. Respiratory: Negative for shortness of breath. Gastrointestinal: Negative for abdominal pain, vomiting and  diarrhea. Genitourinary: Minimal difficulty urinating per patient but no dysuria. Musculoskeletal: Negative for musculoskeletal complaints Skin: Negative for skin complaints  Neurological: Negative for headache All other ROS negative  ____________________________________________   PHYSICAL EXAM:  VITAL SIGNS: ED Triage Vitals  Enc Vitals Group     BP 05/07/19 1446 (!) 145/94     Pulse Rate 05/07/19 1446 (!) 124     Resp 05/07/19 1446 16     Temp 05/07/19 1446 97.8 F (36.6 C)     Temp Source 05/07/19 1446 Oral     SpO2 05/07/19 1446 99 %     Weight 05/07/19 1436 180 lb (81.6 kg)     Height 05/07/19 1436 5\' 9"  (1.753 m)     Head Circumference --      Peak Flow --      Pain Score 05/07/19 1435 6     Pain Loc --      Pain Edu? --      Excl. in GC? --     Constitutional: Alert and oriented. Well appearing and in no distress. Eyes: Normal exam ENT      Head: Normocephalic and atraumatic.      Mouth/Throat: Mucous membranes are moist. Cardiovascular: Normal rate, regular rhythm.  Respiratory: Normal respiratory effort without tachypnea nor retractions. Breath sounds are clear  Gastrointestinal: Soft and nontender. No distention. Musculoskeletal: Nontender with normal range of motion in all extremities.  Neurologic:  Normal speech and language. No gross focal neurologic deficits Skin:  Skin is warm, dry and intact.  Psychiatric: Mood and affect are normal.   ____________________________________________    EKG  EKG viewed and interpreted by myself shows sinus tachycardia 124 bpm with a narrow QRS, normal axis, normal intervals, nonspecific ST changes.  No concerning ST changes.  ____________________________________________   INITIAL IMPRESSION / ASSESSMENT AND PLAN / ED COURSE  Pertinent labs & imaging results that were available during my care of the patient were reviewed by me and considered in my medical decision making (see chart for details).   Patient presents  emergency department for weakness, dizziness and mild confusion earlier today while mowing the lawn.  Patient states he was able to cool down and felt like he returned back to normal.  Patient does have chronic pain in his left hip and left knee but sees an orthopedist for this, denies any acute exacerbation.  Upon arrival patient is noted to be tachycardic around 120 bpm.  Denies any alcohol use today, does state occasional alcohol use but denies daily use.  No drug use.  As prescribed Ultram and Flexeril at home although denies taking them today.    Patient's lab work shows an elevated anion gap most consistent with dehydration.  Given the patient's mild tachycardia and dehydration on lab work we will IV hydrate.  I have also added on a troponin as a precaution.  We will obtain a urinalysis as well.  Overall the patient appears well, is alert and oriented x4 currently denies any confusion at this time.  Differential would include heat exhaustion, stress/anxiety, less  likely TIA, dehydration/hypovolemia.  We will reassess after IV hydration.  IV hydration pending.  Troponin pending.  Patient care signed out to oncoming physician.  Dennis Davidson was evaluated in Emergency Department on 05/07/2019 for the symptoms described in the history of present illness. He was evaluated in the context of the global COVID-19 pandemic, which necessitated consideration that the patient might be at risk for infection with the SARS-CoV-2 virus that causes COVID-19. Institutional protocols and algorithms that pertain to the evaluation of patients at risk for COVID-19 are in a state of rapid change based on information released by regulatory bodies including the CDC and federal and state organizations. These policies and algorithms were followed during the patient's care in the ED.  ____________________________________________   FINAL CLINICAL IMPRESSION(S) / ED DIAGNOSES  Dehydration Dizziness   Harvest Dark,  MD 05/07/19 1806

## 2019-05-27 ENCOUNTER — Other Ambulatory Visit: Payer: Self-pay | Admitting: Physical Medicine and Rehabilitation

## 2019-05-27 DIAGNOSIS — M25552 Pain in left hip: Secondary | ICD-10-CM

## 2019-06-10 ENCOUNTER — Other Ambulatory Visit: Payer: Self-pay

## 2019-06-10 ENCOUNTER — Ambulatory Visit
Admission: RE | Admit: 2019-06-10 | Discharge: 2019-06-10 | Disposition: A | Discharge status: Home / Self Care | Payer: Managed Care, Other (non HMO) | Source: Ambulatory Visit | Attending: Physical Medicine and Rehabilitation | Admitting: Physical Medicine and Rehabilitation

## 2019-06-10 DIAGNOSIS — M25552 Pain in left hip: Secondary | ICD-10-CM | POA: Diagnosis not present

## 2019-06-26 ENCOUNTER — Other Ambulatory Visit: Payer: Self-pay

## 2019-06-26 ENCOUNTER — Encounter
Admission: RE | Admit: 2019-06-26 | Discharge: 2019-06-26 | Disposition: A | Discharge status: Home / Self Care | Payer: Managed Care, Other (non HMO) | Source: Ambulatory Visit | Attending: Orthopedic Surgery | Admitting: Orthopedic Surgery

## 2019-06-26 DIAGNOSIS — Z20828 Contact with and (suspected) exposure to other viral communicable diseases: Secondary | ICD-10-CM | POA: Diagnosis not present

## 2019-06-26 DIAGNOSIS — I10 Essential (primary) hypertension: Secondary | ICD-10-CM | POA: Diagnosis not present

## 2019-06-26 DIAGNOSIS — E785 Hyperlipidemia, unspecified: Secondary | ICD-10-CM | POA: Diagnosis not present

## 2019-06-26 DIAGNOSIS — Z01818 Encounter for other preprocedural examination: Secondary | ICD-10-CM | POA: Diagnosis not present

## 2019-06-26 HISTORY — DX: Anxiety disorder, unspecified: F41.9

## 2019-06-26 HISTORY — DX: Cardiac arrhythmia, unspecified: I49.9

## 2019-06-26 LAB — CBC
HCT: 35.6 % — ABNORMAL LOW (ref 39.0–52.0)
Hemoglobin: 11.9 g/dL — ABNORMAL LOW (ref 13.0–17.0)
MCH: 32.2 pg (ref 26.0–34.0)
MCHC: 33.4 g/dL (ref 30.0–36.0)
MCV: 96.2 fL (ref 80.0–100.0)
Platelets: 212 10*3/uL (ref 150–400)
RBC: 3.7 MIL/uL — ABNORMAL LOW (ref 4.22–5.81)
RDW: 15.8 % — ABNORMAL HIGH (ref 11.5–15.5)
WBC: 8.2 10*3/uL (ref 4.0–10.5)
nRBC: 0 % (ref 0.0–0.2)

## 2019-06-26 LAB — URINALYSIS, ROUTINE W REFLEX MICROSCOPIC
Bilirubin Urine: NEGATIVE
Glucose, UA: NEGATIVE mg/dL
Hgb urine dipstick: NEGATIVE
Ketones, ur: NEGATIVE mg/dL
Nitrite: NEGATIVE
Protein, ur: 30 mg/dL — AB
Specific Gravity, Urine: 1.01 (ref 1.005–1.030)
pH: 7 (ref 5.0–8.0)

## 2019-06-26 LAB — BASIC METABOLIC PANEL
Anion gap: 11 (ref 5–15)
BUN: 5 mg/dL — ABNORMAL LOW (ref 6–20)
CO2: 25 mmol/L (ref 22–32)
Calcium: 8.9 mg/dL (ref 8.9–10.3)
Chloride: 97 mmol/L — ABNORMAL LOW (ref 98–111)
Creatinine, Ser: 0.71 mg/dL (ref 0.61–1.24)
GFR calc Af Amer: >60 mL/min (ref >60)
GFR calc non Af Amer: >60 mL/min (ref >60)
Glucose, Bld: 107 mg/dL — ABNORMAL HIGH (ref 70–99)
Potassium: 3.3 mmol/L — ABNORMAL LOW (ref 3.5–5.1)
Sodium: 133 mmol/L — ABNORMAL LOW (ref 135–145)

## 2019-06-26 LAB — APTT: aPTT: 26 seconds (ref 24–36)

## 2019-06-26 LAB — TYPE AND SCREEN
ABO/RH(D): A POS
Antibody Screen: NEGATIVE

## 2019-06-26 LAB — PROTIME-INR
INR: 1 (ref 0.8–1.2)
Prothrombin Time: 12.9 seconds (ref 11.4–15.2)

## 2019-06-26 LAB — SURGICAL PCR SCREEN
MRSA, PCR: NEGATIVE
Staphylococcus aureus: NEGATIVE

## 2019-06-26 LAB — SEDIMENTATION RATE: Sed Rate: 46 mm/hr — ABNORMAL HIGH (ref 0–15)

## 2019-06-26 NOTE — Patient Instructions (Signed)
Your procedure is scheduled on: Tuesday 07/01/19 Report to [facility]. To find out your arrival time please call [phone] between 1PM - 3PM on Monday 06/30/19.  Remember: Instructions that are not followed completely may result in serious medical risk, up to and including death, or upon the discretion of your surgeon and anesthesiologist your surgery may need to be rescheduled.     _X__ 1. Do not eat food after midnight the night before your procedure.                 No gum chewing or hard candies. You may drink clear liquids up to 2 hours                 before you are scheduled to arrive for your surgery- DO not drink clear                 liquids within 2 hours of the start of your surgery.                 Clear Liquids include:  water, apple juice without pulp, clear carbohydrate                 drink such as Clearfast or Gatorade, Black Coffee or Tea (Do not add                 anything to coffee or tea). Diabetics water only  __X__2.  On the morning of surgery brush your teeth with toothpaste and water, you                 may rinse your mouth with mouthwash if you wish.  Do not swallow any              toothpaste of mouthwash.     _X__ 3.  No Alcohol for 24 hours before or after surgery.   _X__ 4.  Do Not Smoke or use e-cigarettes For 24 Hours Prior to Your Surgery.                 Do not use any chewable tobacco products for at least 6 hours prior to                 surgery.  ____  5.  Bring all medications with you on the day of surgery if instructed.   __X__  6.  Notify your doctor if there is any change in your medical condition      (cold, fever, infections).     Do not wear jewelry, make-up, hairpins, clips or nail polish. Do not wear lotions, powders, or perfumes.  Do not shave 48 hours prior to surgery. Men may shave face and neck. Do not bring valuables to the hospital.    [facility] is not responsible for any  belongings or valuables.  Contacts, dentures/partials or body piercings may not be worn into surgery. Bring a case for your contacts, glasses or hearing aids, a denture cup will be supplied. Leave your suitcase in the car. After surgery it may be brought to your room. For patients admitted to the hospital, discharge time is determined by your treatment team.   Patients discharged the day of surgery will not be allowed to drive home.   Please read over the following fact sheets that you were given:   MRSA Information  __X__ Take these medicines the morning of surgery with A SIP OF WATER:  1. escitalopram (LEXAPRO  2. metoprolol tartrate (LOPRESSOR)   3.   4.  5.  6.  ____ Fleet Enema (as directed)   __X__ Use CHG Soap/SAGE wipes as directed  ____ Use inhalers on the day of surgery  ____ Stop metformin/Janumet/Farxiga 2 days prior to surgery    ____ Take 1/2 of usual insulin dose the night before surgery. No insulin the morning          of surgery.   ____ Stop Blood Thinners Coumadin/Plavix/Xarelto/Pleta/Pradaxa/Eliquis/Effient/Aspirin  on   Or contact your Surgeon, Cardiologist or Medical Doctor regarding  ability to stop your blood thinners  __X__ Stop Anti-inflammatories 7 days before surgery such as Advil, Ibuprofen, Motrin,  BC or Goodies Powder, Naprosyn, Naproxen, Aleve, Aspirin    __X__ Stop all herbal supplements, fish oil or vitamin E until after surgery.    ____ Bring C-Pap to the hospital.

## 2019-06-26 NOTE — Pre-Procedure Instructions (Signed)
K+ results faxed to Dr Theodore Demark office.

## 2019-06-27 ENCOUNTER — Other Ambulatory Visit
Admission: RE | Admit: 2019-06-27 | Discharge: 2019-06-27 | Disposition: A | Discharge status: Home / Self Care | Payer: Managed Care, Other (non HMO) | Source: Ambulatory Visit | Attending: Orthopedic Surgery | Admitting: Orthopedic Surgery

## 2019-06-27 DIAGNOSIS — Z01818 Encounter for other preprocedural examination: Secondary | ICD-10-CM | POA: Diagnosis not present

## 2019-06-27 LAB — URINE CULTURE: Culture: NO GROWTH

## 2019-06-27 LAB — SARS CORONAVIRUS 2 (TAT 6-24 HRS): SARS Coronavirus 2: NEGATIVE

## 2019-06-30 ENCOUNTER — Encounter: Payer: Self-pay | Admitting: Anesthesiology

## 2019-07-01 ENCOUNTER — Other Ambulatory Visit: Payer: Self-pay

## 2019-07-01 ENCOUNTER — Encounter: Payer: Self-pay | Admitting: *Deleted

## 2019-07-01 ENCOUNTER — Inpatient Hospital Stay
Admission: RE | Admit: 2019-07-01 | Discharge: 2019-07-03 | DRG: 470 | Disposition: A | Discharge status: Home Health | Payer: Managed Care, Other (non HMO) | Attending: Orthopedic Surgery | Admitting: Orthopedic Surgery

## 2019-07-01 ENCOUNTER — Inpatient Hospital Stay: Payer: Managed Care, Other (non HMO)

## 2019-07-01 ENCOUNTER — Encounter
Admission: RE | Disposition: A | Discharge status: Home Health | Payer: Self-pay | Source: Home / Self Care | Attending: Orthopedic Surgery

## 2019-07-01 ENCOUNTER — Inpatient Hospital Stay: Payer: Managed Care, Other (non HMO) | Admitting: Anesthesiology

## 2019-07-01 DIAGNOSIS — Z419 Encounter for procedure for purposes other than remedying health state, unspecified: Secondary | ICD-10-CM

## 2019-07-01 DIAGNOSIS — M87052 Idiopathic aseptic necrosis of left femur: Secondary | ICD-10-CM | POA: Diagnosis present

## 2019-07-01 DIAGNOSIS — M659 Synovitis and tenosynovitis, unspecified: Secondary | ICD-10-CM | POA: Diagnosis present

## 2019-07-01 DIAGNOSIS — M879 Osteonecrosis, unspecified: Secondary | ICD-10-CM | POA: Diagnosis present

## 2019-07-01 DIAGNOSIS — I10 Essential (primary) hypertension: Secondary | ICD-10-CM | POA: Diagnosis present

## 2019-07-01 DIAGNOSIS — G8918 Other acute postprocedural pain: Secondary | ICD-10-CM

## 2019-07-01 DIAGNOSIS — Z96642 Presence of left artificial hip joint: Secondary | ICD-10-CM

## 2019-07-01 HISTORY — DX: Unspecified osteoarthritis, unspecified site: M19.90

## 2019-07-01 HISTORY — PX: TOTAL HIP ARTHROPLASTY: SHX124

## 2019-07-01 LAB — CBC
HCT: 33.8 % — ABNORMAL LOW (ref 39.0–52.0)
Hemoglobin: 10.4 g/dL — ABNORMAL LOW (ref 13.0–17.0)
MCH: 32 pg (ref 26.0–34.0)
MCHC: 30.8 g/dL (ref 30.0–36.0)
MCV: 104 fL — ABNORMAL HIGH (ref 80.0–100.0)
Platelets: 147 10*3/uL — ABNORMAL LOW (ref 150–400)
RBC: 3.25 MIL/uL — ABNORMAL LOW (ref 4.22–5.81)
RDW: 15.7 % — ABNORMAL HIGH (ref 11.5–15.5)
WBC: 7.2 10*3/uL (ref 4.0–10.5)
nRBC: 0 % (ref 0.0–0.2)

## 2019-07-01 LAB — POCT I-STAT 4, (NA,K, GLUC, HGB,HCT)
Glucose, Bld: 97 mg/dL (ref 70–99)
HCT: 32 % — ABNORMAL LOW (ref 39.0–52.0)
Hemoglobin: 10.9 g/dL — ABNORMAL LOW (ref 13.0–17.0)
Potassium: 3.6 mmol/L (ref 3.5–5.1)
Sodium: 134 mmol/L — ABNORMAL LOW (ref 135–145)

## 2019-07-01 LAB — CREATININE, SERUM
Creatinine, Ser: 0.88 mg/dL (ref 0.61–1.24)
GFR calc Af Amer: >60 mL/min (ref >60)
GFR calc non Af Amer: >60 mL/min (ref >60)

## 2019-07-01 SURGERY — ARTHROPLASTY, HIP, TOTAL,POSTERIOR APPROACH
Anesthesia: Spinal | Laterality: Left

## 2019-07-01 MED ORDER — SODIUM CHLORIDE 0.9 % IV SOLN
INTRAVENOUS | Status: DC
  Administered 2019-07-01 – 2019-07-02 (×2): via INTRAVENOUS

## 2019-07-01 MED ORDER — ALPRAZOLAM 0.5 MG PO TABS
0.5000 mg | ORAL_TABLET | Freq: Three times a day (TID) | ORAL | Status: DC | PRN
  Administered 2019-07-02 (×2): 0.5 mg via ORAL
  Filled 2019-07-01 (×2): qty 1

## 2019-07-01 MED ORDER — PROPOFOL 10 MG/ML IV BOLUS
INTRAVENOUS | Status: DC | PRN
  Administered 2019-07-01: 20 mg via INTRAVENOUS

## 2019-07-01 MED ORDER — ESCITALOPRAM OXALATE 10 MG PO TABS
20.0000 mg | ORAL_TABLET | Freq: Every day | ORAL | Status: DC
  Administered 2019-07-02 – 2019-07-03 (×2): 20 mg via ORAL
  Filled 2019-07-01 (×2): qty 2

## 2019-07-01 MED ORDER — MAGNESIUM CITRATE PO SOLN: 1.0000 | Freq: Once | ORAL | Status: DC | PRN

## 2019-07-01 MED ORDER — METOCLOPRAMIDE HCL 10 MG PO TABS: 5.0000 mg | ORAL_TABLET | Freq: Three times a day (TID) | ORAL | Status: DC | PRN

## 2019-07-01 MED ORDER — ALUM & MAG HYDROXIDE-SIMETH 200-200-20 MG/5ML PO SUSP
30.0000 mL | ORAL | Status: DC | PRN
  Administered 2019-07-02 (×2): 30 mL via ORAL
  Filled 2019-07-01 (×2): qty 30

## 2019-07-01 MED ORDER — LACTATED RINGERS IV SOLN: INTRAVENOUS | Status: DC

## 2019-07-01 MED ORDER — METHOCARBAMOL 500 MG PO TABS
500.0000 mg | ORAL_TABLET | Freq: Four times a day (QID) | ORAL | Status: DC | PRN
  Administered 2019-07-01 – 2019-07-02 (×4): 500 mg via ORAL
  Filled 2019-07-01 (×4): qty 1

## 2019-07-01 MED ORDER — LISINOPRIL-HYDROCHLOROTHIAZIDE 10-12.5 MG PO TABS
1.0000 | ORAL_TABLET | Freq: Every day | ORAL | Status: DC
  Administered 2019-07-01: 14:00:00 1 via ORAL

## 2019-07-01 MED ORDER — MORPHINE SULFATE (PF) 2 MG/ML IV SOLN: 0.5000 mg | INTRAVENOUS | Status: DC | PRN

## 2019-07-01 MED ORDER — FAMOTIDINE 20 MG PO TABS
ORAL_TABLET | ORAL | Status: AC
  Administered 2019-07-01: 08:00:00 20 mg via ORAL
  Filled 2019-07-01: qty 1

## 2019-07-01 MED ORDER — FENTANYL CITRATE (PF) 100 MCG/2ML IJ SOLN
INTRAMUSCULAR | Status: AC
  Filled 2019-07-01: qty 2

## 2019-07-01 MED ORDER — BUPIVACAINE-EPINEPHRINE (PF) 0.25% -1:200000 IJ SOLN
INTRAMUSCULAR | Status: AC
  Filled 2019-07-01: qty 30

## 2019-07-01 MED ORDER — ATORVASTATIN CALCIUM 80 MG PO TABS
80.0000 mg | ORAL_TABLET | Freq: Every evening | ORAL | Status: DC
  Administered 2019-07-01 – 2019-07-02 (×2): 80 mg via ORAL
  Filled 2019-07-01: qty 4
  Filled 2019-07-01 (×2): qty 1
  Filled 2019-07-01: qty 4
  Filled 2019-07-01: qty 1

## 2019-07-01 MED ORDER — METOPROLOL TARTRATE 25 MG PO TABS
25.0000 mg | ORAL_TABLET | Freq: Two times a day (BID) | ORAL | Status: DC
  Administered 2019-07-01 – 2019-07-03 (×4): 25 mg via ORAL
  Filled 2019-07-01 (×4): qty 1

## 2019-07-01 MED ORDER — PROPOFOL 500 MG/50ML IV EMUL
INTRAVENOUS | Status: DC | PRN
  Administered 2019-07-01: 100 ug/kg/min via INTRAVENOUS

## 2019-07-01 MED ORDER — CEFAZOLIN SODIUM-DEXTROSE 2-4 GM/100ML-% IV SOLN
INTRAVENOUS | Status: AC
  Filled 2019-07-01: qty 100

## 2019-07-01 MED ORDER — ONDANSETRON HCL 4 MG/2ML IJ SOLN: 4.0000 mg | Freq: Once | INTRAMUSCULAR | Status: DC | PRN

## 2019-07-01 MED ORDER — CEFAZOLIN SODIUM-DEXTROSE 2-4 GM/100ML-% IV SOLN
2.0000 g | Freq: Once | INTRAVENOUS | Status: AC
  Administered 2019-07-01: 2 g via INTRAVENOUS

## 2019-07-01 MED ORDER — MIDAZOLAM HCL 2 MG/2ML IJ SOLN
INTRAMUSCULAR | Status: AC
  Filled 2019-07-01: qty 2

## 2019-07-01 MED ORDER — ACETAMINOPHEN 325 MG PO TABS: 325.0000 mg | ORAL_TABLET | Freq: Four times a day (QID) | ORAL | Status: DC | PRN

## 2019-07-01 MED ORDER — LACTATED RINGERS IV SOLN
INTRAVENOUS | Status: DC | PRN
  Administered 2019-07-01: 10:00:00 via INTRAVENOUS

## 2019-07-01 MED ORDER — SODIUM CHLORIDE 0.9 % IV SOLN
INTRAVENOUS | Status: DC | PRN
  Administered 2019-07-01: 30 ug/min via INTRAVENOUS

## 2019-07-01 MED ORDER — NEOMYCIN-POLYMYXIN B GU 40-200000 IR SOLN
Status: DC | PRN
  Administered 2019-07-01: 4 mL

## 2019-07-01 MED ORDER — DIPHENHYDRAMINE HCL 12.5 MG/5ML PO ELIX: 12.5000 mg | ORAL_SOLUTION | ORAL | Status: DC | PRN

## 2019-07-01 MED ORDER — NEOMYCIN-POLYMYXIN B GU 40-200000 IR SOLN
Status: AC
  Filled 2019-07-01: qty 20

## 2019-07-01 MED ORDER — BUPIVACAINE LIPOSOME 1.3 % IJ SUSP
INTRAMUSCULAR | Status: AC
  Filled 2019-07-01: qty 20

## 2019-07-01 MED ORDER — LORATADINE 10 MG PO TABS
10.0000 mg | ORAL_TABLET | Freq: Every day | ORAL | Status: DC
  Administered 2019-07-02 – 2019-07-03 (×2): 10 mg via ORAL
  Filled 2019-07-01 (×2): qty 1

## 2019-07-01 MED ORDER — BUPIVACAINE-EPINEPHRINE 0.25% -1:200000 IJ SOLN
INTRAMUSCULAR | Status: DC | PRN
  Administered 2019-07-01: 30 mL

## 2019-07-01 MED ORDER — SODIUM CHLORIDE 0.9 % IV SOLN
INTRAVENOUS | Status: DC | PRN
  Administered 2019-07-01: 60 mL

## 2019-07-01 MED ORDER — FAMOTIDINE 20 MG PO TABS
20.0000 mg | ORAL_TABLET | Freq: Once | ORAL | Status: AC
  Administered 2019-07-01: 20 mg via ORAL

## 2019-07-01 MED ORDER — CEFAZOLIN SODIUM-DEXTROSE 2-4 GM/100ML-% IV SOLN
2.0000 g | Freq: Four times a day (QID) | INTRAVENOUS | Status: AC
  Administered 2019-07-01 – 2019-07-02 (×3): 2 g via INTRAVENOUS
  Filled 2019-07-01 (×3): qty 100

## 2019-07-01 MED ORDER — MENTHOL 3 MG MT LOZG: 1.0000 | LOZENGE | OROMUCOSAL | Status: DC | PRN

## 2019-07-01 MED ORDER — METOCLOPRAMIDE HCL 5 MG/ML IJ SOLN: 5.0000 mg | Freq: Three times a day (TID) | INTRAMUSCULAR | Status: DC | PRN

## 2019-07-01 MED ORDER — TRAMADOL HCL 50 MG PO TABS
50.0000 mg | ORAL_TABLET | Freq: Four times a day (QID) | ORAL | Status: DC
  Administered 2019-07-01 – 2019-07-03 (×6): 50 mg via ORAL
  Filled 2019-07-01 (×7): qty 1

## 2019-07-01 MED ORDER — ACETAMINOPHEN 500 MG PO TABS
500.0000 mg | ORAL_TABLET | Freq: Four times a day (QID) | ORAL | Status: AC
  Administered 2019-07-01 – 2019-07-02 (×4): 500 mg via ORAL
  Filled 2019-07-01 (×4): qty 1

## 2019-07-01 MED ORDER — METHOCARBAMOL 1000 MG/10ML IJ SOLN
500.0000 mg | Freq: Four times a day (QID) | INTRAVENOUS | Status: DC | PRN
  Filled 2019-07-01: qty 5

## 2019-07-01 MED ORDER — BUPIVACAINE HCL (PF) 0.5 % IJ SOLN
INTRAMUSCULAR | Status: DC | PRN
  Administered 2019-07-01: 3 mL

## 2019-07-01 MED ORDER — MAGNESIUM HYDROXIDE 400 MG/5ML PO SUSP
30.0000 mL | Freq: Every day | ORAL | Status: DC | PRN
  Administered 2019-07-02: 30 mL via ORAL
  Filled 2019-07-01: qty 30

## 2019-07-01 MED ORDER — HYDROCODONE-ACETAMINOPHEN 7.5-325 MG PO TABS
1.0000 | ORAL_TABLET | ORAL | Status: DC | PRN
  Administered 2019-07-01 – 2019-07-02 (×2): 2 via ORAL
  Administered 2019-07-02: 1 via ORAL
  Administered 2019-07-02: 2 via ORAL
  Filled 2019-07-01 (×3): qty 2
  Filled 2019-07-01: qty 1

## 2019-07-01 MED ORDER — PROPOFOL 500 MG/50ML IV EMUL
INTRAVENOUS | Status: AC
  Filled 2019-07-01: qty 50

## 2019-07-01 MED ORDER — ONDANSETRON HCL 4 MG/2ML IJ SOLN
4.0000 mg | Freq: Four times a day (QID) | INTRAMUSCULAR | Status: DC | PRN
  Filled 2019-07-01: qty 2

## 2019-07-01 MED ORDER — PHENOL 1.4 % MT LIQD: 1.0000 | OROMUCOSAL | Status: DC | PRN

## 2019-07-01 MED ORDER — ONDANSETRON HCL 4 MG PO TABS
4.0000 mg | ORAL_TABLET | Freq: Four times a day (QID) | ORAL | Status: DC | PRN
  Administered 2019-07-02: 10:00:00 4 mg via ORAL
  Filled 2019-07-01: qty 1

## 2019-07-01 MED ORDER — MIDAZOLAM HCL 5 MG/5ML IJ SOLN
INTRAMUSCULAR | Status: DC | PRN
  Administered 2019-07-01 (×2): 1 mg via INTRAVENOUS

## 2019-07-01 MED ORDER — HYDROCODONE-ACETAMINOPHEN 5-325 MG PO TABS
1.0000 | ORAL_TABLET | ORAL | Status: DC | PRN
  Administered 2019-07-02: 2 via ORAL
  Administered 2019-07-03: 1 via ORAL
  Filled 2019-07-01: qty 2
  Filled 2019-07-01: qty 1

## 2019-07-01 MED ORDER — LISINOPRIL 10 MG PO TABS
10.0000 mg | ORAL_TABLET | Freq: Every day | ORAL | Status: DC
  Administered 2019-07-01 – 2019-07-03 (×3): 10 mg via ORAL
  Filled 2019-07-01 (×3): qty 1

## 2019-07-01 MED ORDER — HYDROCHLOROTHIAZIDE 12.5 MG PO CAPS
12.5000 mg | ORAL_CAPSULE | Freq: Every day | ORAL | Status: DC
  Administered 2019-07-01 – 2019-07-02 (×2): 12.5 mg via ORAL
  Filled 2019-07-01 (×2): qty 1

## 2019-07-01 MED ORDER — LIDOCAINE HCL (PF) 2 % IJ SOLN
INTRAMUSCULAR | Status: AC
  Filled 2019-07-01: qty 10

## 2019-07-01 MED ORDER — ENOXAPARIN SODIUM 40 MG/0.4ML ~~LOC~~ SOLN
40.0000 mg | SUBCUTANEOUS | Status: DC
  Administered 2019-07-02 – 2019-07-03 (×2): 40 mg via SUBCUTANEOUS
  Filled 2019-07-01 (×2): qty 0.4

## 2019-07-01 MED ORDER — FENTANYL CITRATE (PF) 100 MCG/2ML IJ SOLN
INTRAMUSCULAR | Status: DC | PRN
  Administered 2019-07-01: 100 ug via INTRAVENOUS

## 2019-07-01 MED ORDER — ZOLPIDEM TARTRATE 5 MG PO TABS
5.0000 mg | ORAL_TABLET | Freq: Every evening | ORAL | Status: DC | PRN
  Administered 2019-07-02: 5 mg via ORAL
  Filled 2019-07-01: qty 1

## 2019-07-01 MED ORDER — SODIUM CHLORIDE (PF) 0.9 % IJ SOLN
INTRAMUSCULAR | Status: AC
  Filled 2019-07-01: qty 100

## 2019-07-01 MED ORDER — BISACODYL 10 MG RE SUPP: 10.0000 mg | Freq: Every day | RECTAL | Status: DC | PRN

## 2019-07-01 MED ORDER — ADULT MULTIVITAMIN W/MINERALS CH
1.0000 | ORAL_TABLET | Freq: Every day | ORAL | Status: DC
  Administered 2019-07-02 – 2019-07-03 (×2): 1 via ORAL
  Filled 2019-07-01 (×2): qty 1

## 2019-07-01 MED ORDER — FENTANYL CITRATE (PF) 100 MCG/2ML IJ SOLN: 25.0000 ug | INTRAMUSCULAR | Status: DC | PRN

## 2019-07-01 MED ORDER — DOCUSATE SODIUM 100 MG PO CAPS
100.0000 mg | ORAL_CAPSULE | Freq: Two times a day (BID) | ORAL | Status: DC
  Administered 2019-07-01 – 2019-07-02 (×2): 100 mg via ORAL
  Filled 2019-07-01 (×4): qty 1

## 2019-07-01 SURGICAL SUPPLY — 61 items
BLADE SAGITTAL AGGR TOOTH XLG (BLADE) ×2 IMPLANT
BNDG COHESIVE 6X5 TAN STRL LF (GAUZE/BANDAGES/DRESSINGS) ×6 IMPLANT
CANISTER SUCT 1200ML W/VALVE (MISCELLANEOUS) ×2 IMPLANT
CANISTER WOUND CARE 500ML ATS (WOUND CARE) ×4 IMPLANT
CHLORAPREP W/TINT 26 (MISCELLANEOUS) ×2 IMPLANT
COVER BACK TABLE REUSABLE LG (DRAPES) ×2 IMPLANT
COVER WAND RF STERILE (DRAPES) ×2 IMPLANT
DRAPE 3/4 80X56 (DRAPES) ×6 IMPLANT
DRAPE C-ARM XRAY 36X54 (DRAPES) ×2 IMPLANT
DRAPE INCISE IOBAN 66X60 STRL (DRAPES) IMPLANT
DRAPE POUCH INSTRU U-SHP 10X18 (DRAPES) ×2 IMPLANT
DRESSING SURGICEL FIBRLLR 1X2 (HEMOSTASIS) ×2 IMPLANT
DRSG OPSITE POSTOP 4X8 (GAUZE/BANDAGES/DRESSINGS) ×4 IMPLANT
DRSG SURGICEL FIBRILLAR 1X2 (HEMOSTASIS) ×4
ELECT BLADE 6.5 EXT (BLADE) ×2 IMPLANT
ELECT REM PT RETURN 9FT ADLT (ELECTROSURGICAL) ×2
ELECTRODE REM PT RTRN 9FT ADLT (ELECTROSURGICAL) ×1 IMPLANT
GLOVE BIOGEL PI IND STRL 9 (GLOVE) ×1 IMPLANT
GLOVE BIOGEL PI INDICATOR 9 (GLOVE) ×1
GLOVE SURG SYN 9.0  PF PI (GLOVE) ×2
GLOVE SURG SYN 9.0 PF PI (GLOVE) ×2 IMPLANT
GOWN SRG 2XL LVL 4 RGLN SLV (GOWNS) ×1 IMPLANT
GOWN STRL NON-REIN 2XL LVL4 (GOWNS) ×1
GOWN STRL REUS W/ TWL LRG LVL3 (GOWN DISPOSABLE) ×1 IMPLANT
GOWN STRL REUS W/TWL LRG LVL3 (GOWN DISPOSABLE) ×1
HEAD FEMORAL 28MM SZ S (Head) ×2 IMPLANT
HEMOVAC 400CC 10FR (MISCELLANEOUS) IMPLANT
HOLDER FOLEY CATH W/STRAP (MISCELLANEOUS) ×2 IMPLANT
HOOD PEEL AWAY FLYTE STAYCOOL (MISCELLANEOUS) ×2 IMPLANT
KIT PREVENA INCISION MGT 13 (CANNISTER) ×2 IMPLANT
KIT PREVENA INCISION MGT20CM45 (CANNISTER) ×2 IMPLANT
LINER DM 28MM (Liner) ×2 IMPLANT
LINER DM SZH 28X56 (Liner) ×1 IMPLANT
MAT ABSORB  FLUID 56X50 GRAY (MISCELLANEOUS) ×1
MAT ABSORB FLUID 56X50 GRAY (MISCELLANEOUS) ×1 IMPLANT
NDL SAFETY ECLIPSE 18X1.5 (NEEDLE) ×1 IMPLANT
NEEDLE HYPO 18GX1.5 SHARP (NEEDLE) ×1
NEEDLE SPNL 20GX3.5 QUINCKE YW (NEEDLE) ×4 IMPLANT
NS IRRIG 1000ML POUR BTL (IV SOLUTION) ×2 IMPLANT
PACK HIP COMPR (MISCELLANEOUS) ×2 IMPLANT
SCALPEL PROTECTED #10 DISP (BLADE) ×4 IMPLANT
SHELL ACETABULAR DM  56MM (Shell) ×2 IMPLANT
SOL PREP PVP 2OZ (MISCELLANEOUS) ×2
SOLUTION PREP PVP 2OZ (MISCELLANEOUS) ×1 IMPLANT
SPONGE DRAIN TRACH 4X4 STRL 2S (GAUZE/BANDAGES/DRESSINGS) ×2 IMPLANT
STAPLER SKIN PROX 35W (STAPLE) ×2 IMPLANT
STEM FEMORAL SZ3  STD COLLARED (Stem) ×2 IMPLANT
STRAP SAFETY 5IN WIDE (MISCELLANEOUS) ×2 IMPLANT
SUT DVC 2 QUILL PDO  T11 36X36 (SUTURE) ×1
SUT DVC 2 QUILL PDO T11 36X36 (SUTURE) ×1 IMPLANT
SUT SILK 0 (SUTURE) ×1
SUT SILK 0 30XBRD TIE 6 (SUTURE) ×1 IMPLANT
SUT V-LOC 90 ABS DVC 3-0 CL (SUTURE) ×2 IMPLANT
SUT VIC AB 1 CT1 36 (SUTURE) ×2 IMPLANT
SYR 20ML LL LF (SYRINGE) ×2 IMPLANT
SYR 30ML LL (SYRINGE) ×2 IMPLANT
SYR 50ML LL SCALE MARK (SYRINGE) ×4 IMPLANT
SYR BULB IRRIG 60ML STRL (SYRINGE) ×2 IMPLANT
TAPE MICROFOAM 4IN (TAPE) ×2 IMPLANT
TOWEL OR 17X26 4PK STRL BLUE (TOWEL DISPOSABLE) ×2 IMPLANT
TRAY FOLEY MTR SLVR 16FR STAT (SET/KITS/TRAYS/PACK) ×2 IMPLANT

## 2019-07-01 NOTE — Transfer of Care (Signed)
Immediate Anesthesia Transfer of Care Note  Patient: Dennis Davidson  Procedure(s) Performed: LEFT TOTAL HIP ARTHROPLASTY (Left )  Patient Location: PACU  Anesthesia Type:Spinal  Level of Consciousness: awake, alert  and oriented  Airway & Oxygen Therapy: Patient Spontanous Breathing  Post-op Assessment: Report given to RN, Post -op Vital signs reviewed and stable and Post -op Vital signs reviewed and unstable, Anesthesiologist notified  Post vital signs: Reviewed and stable  Last Vitals:  Vitals Value Taken Time  BP 110/70 07/01/19 1216  Temp    Pulse 84 07/01/19 1216  Resp 17 07/01/19 1216  SpO2 99 % 07/01/19 1216  Vitals shown include unvalidated device data.  Last Pain:  Vitals:   07/01/19 0851  TempSrc: Tympanic  PainSc: 9       Patients Stated Pain Goal: 1 (83/09/40 7680)  Complications: No apparent anesthesia complications

## 2019-07-01 NOTE — Progress Notes (Signed)
PT Cancellation Note  Patient Details Name: Dennis Davidson MRN: [id number] DOB: December 23, 1975   Cancelled Treatment:    Reason Eval/Treat Not Completed: Medical issues which prohibited therapy Attempted to see pt POD0, still completely numb with only very minimal toe wiggle on L after 16:30 on POD0.  Will initiate PT tomorrow when pt is more appropriate post surgery.  Kreg Shropshire, DPT 07/01/2019, 4:40 PM

## 2019-07-01 NOTE — Progress Notes (Signed)
15 minute call to floor. 

## 2019-07-01 NOTE — TOC Progression Note (Signed)
Transition of Care [facility]) - Progression Note    Patient Details  Name: Dennis Davidson MRN: [id number] Date of Birth: Mar 12, 1976  Transition of [facility] CM/SW Contact  Emmanuell Kantz, Lenice Llamas Phone Number: [phone]  07/01/2019, 3:09 PM  Clinical Narrative: Lovenox price requested.            Expected Discharge Plan and Services           Expected Discharge Date: 07/04/19                                     Social Determinants of Health (SDOH) Interventions    Readmission Risk Interventions No flowsheet data found.

## 2019-07-01 NOTE — Anesthesia Procedure Notes (Signed)
Spinal  Patient location during procedure: OR Staffing Performed: resident/CRNA  Preanesthetic Checklist Completed: patient identified, site marked, surgical consent, pre-op evaluation, timeout performed, IV checked, risks and benefits discussed and monitors and equipment checked Spinal Block Patient position: sitting Prep: Betadine Patient monitoring: heart rate, continuous pulse ox, blood pressure and cardiac monitor Approach: midline Location: L4-5 Injection technique: single-shot Needle Needle type: Whitacre and Introducer  Needle gauge: 24 G Needle length: 9 cm Additional Notes Negative paresthesia. Negative blood return. Positive free-flowing CSF. Expiration date of kit checked and confirmed. Patient tolerated procedure well, without complications.

## 2019-07-01 NOTE — Anesthesia Post-op Follow-up Note (Signed)
Anesthesia QCDR form completed.        

## 2019-07-01 NOTE — H&P (Signed)
Reviewed paper H+P, will be scanned into chart. No changes noted.  

## 2019-07-01 NOTE — Progress Notes (Signed)
Glasses returned to patient.

## 2019-07-01 NOTE — Op Note (Signed)
07/01/2019  12:27 PM  PATIENT:  Dennis Davidson  43 y.o. male  PRE-OPERATIVE DIAGNOSIS:  AVASCULAR NECROSIS OR LEFT HIP  POST-OPERATIVE DIAGNOSIS:  AVASCULAR NECROSIS OR LEFT HIP  PROCEDURE:  Procedure(s): LEFT TOTAL HIP ARTHROPLASTY (Left)  SURGEON: Laurene Footman, MD  ASSISTANTS: None  ANESTHESIA:   spinal  EBL:  Total I/O In: 800 [I.V.:800] Out: 250 [Urine:200; Blood:50]  BLOOD ADMINISTERED:none  DRAINS: none   LOCAL MEDICATIONS USED:  MARCAINE    and OTHER Exparel  SPECIMEN:  Source of Specimen:  Left femoral head  DISPOSITION OF SPECIMEN:  PATHOLOGY  COUNTS:  YES  TOURNIQUET:  * No tourniquets in log *  IMPLANTS: medacta AMIS 3 standard, S ceramic head 28 mm, 56 mm Mpact DM cup and liner  DICTATION: .Dragon Dictation   The patient was brought to the operating room and after spinal anesthesia was obtained patient was placed on the operative table with the ipsilateral foot into the Medacta attachment, contralateral leg on a well-padded table. C-arm was brought in and preop template x-ray taken. After prepping and draping in usual sterile fashion appropriate patient identification and timeout procedures were completed. Anterior approach to the hip was obtained and centered over the greater trochanter and TFL muscle. The subcutaneous tissue was incised hemostasis being achieved by electrocautery. TFL fascia was incised and the muscle retracted laterally deep retractor placed. The lateral femoral circumflex vessels were identified and ligated. The anterior capsule was exposed and a capsulotomy performed. The neck was identified and a femoral neck cut carried out with a saw. The head was removed without difficulty and showed sclerotic femoral head and acetabulum. Reaming was carried out to 56 mm and a 56 mm cup trial gave appropriate tightness to the acetabular component a 56 DM cup was impacted into position. The leg was then externally rotated and ischiofemoral and pubofemoral  releases carried out. The femur was sequentially broached to a size 3, size 3 standard with S head trials were placed and the final components chosen. The 3 standard stem was inserted along with a ceramic S 28 mm head and 56 mm liner. The hip was reduced and was stable the wound was thoroughly irrigated with fibrillar placed along the posterior capsule and medial neck.  Exparel was injected throughout the case.  The deep fascia ws closed using a heavy Quill after infiltration of 30 cc of quarter percent Sensorcaine with epinephrine.3-0 V-loc to close the skin with skin staples. Xeroform 4 x 4's ABDs and tape patient was sent to recovery in stable condition.   PLAN OF CARE: Admit to inpatient

## 2019-07-01 NOTE — Progress Notes (Signed)
Alert and oriented pt admitted from PACU. Wife present with patient. Right hip with ice pack in place. Dressing to right hip in place. Wound vac to hip incision with no drainage noted in vac tubing.  Pt regaining sensation to both lower extremities. No c/o pain at this time.

## 2019-07-01 NOTE — Anesthesia Preprocedure Evaluation (Signed)
Anesthesia Evaluation  Patient identified by MRN, date of birth, ID band Patient awake    Reviewed: Allergy & Precautions, NPO status , Patient's Chart, lab work & pertinent test results, reviewed documented beta blocker date and time   Airway Mallampati: III  TM Distance: >3 FB     Dental  (+) Chipped   Pulmonary former smoker,           Cardiovascular hypertension, Pt. on medications and Pt. on home beta blockers      Neuro/Psych  Headaches, PSYCHIATRIC DISORDERS Anxiety Depression    GI/Hepatic   Endo/Other    Renal/GU      Musculoskeletal  (+) Arthritis ,   Abdominal   Peds  Hematology   Anesthesia Other Findings EKG ok.  Reproductive/Obstetrics                             Anesthesia Physical Anesthesia Plan  ASA: III  Anesthesia Plan: Spinal   Post-op Pain Management:    Induction: Intravenous  PONV Risk Score and Plan:   Airway Management Planned:   Additional Equipment:   Intra-op Plan:   Post-operative Plan:   Informed Consent: I have reviewed the patients History and Physical, chart, labs and discussed the procedure including the risks, benefits and alternatives for the proposed anesthesia with the patient or authorized representative who has indicated his/her understanding and acceptance.       Plan Discussed with: CRNA  Anesthesia Plan Comments:         Anesthesia Quick Evaluation

## 2019-07-02 ENCOUNTER — Encounter: Payer: Self-pay | Admitting: Orthopedic Surgery

## 2019-07-02 LAB — BASIC METABOLIC PANEL
Anion gap: 7 (ref 5–15)
BUN: 8 mg/dL (ref 6–20)
CO2: 21 mmol/L — ABNORMAL LOW (ref 22–32)
Calcium: 8.2 mg/dL — ABNORMAL LOW (ref 8.9–10.3)
Chloride: 103 mmol/L (ref 98–111)
Creatinine, Ser: 0.8 mg/dL (ref 0.61–1.24)
GFR calc Af Amer: >60 mL/min (ref >60)
GFR calc non Af Amer: >60 mL/min (ref >60)
Glucose, Bld: 115 mg/dL — ABNORMAL HIGH (ref 70–99)
Potassium: 3.4 mmol/L — ABNORMAL LOW (ref 3.5–5.1)
Sodium: 131 mmol/L — ABNORMAL LOW (ref 135–145)

## 2019-07-02 LAB — SURGICAL PATHOLOGY

## 2019-07-02 MED ORDER — HYDRALAZINE HCL 20 MG/ML IJ SOLN
5.0000 mg | INTRAMUSCULAR | Status: DC | PRN
  Administered 2019-07-02: 5 mg via INTRAVENOUS
  Filled 2019-07-02: qty 1

## 2019-07-02 MED ORDER — POTASSIUM CHLORIDE CRYS ER 20 MEQ PO TBCR
20.0000 meq | EXTENDED_RELEASE_TABLET | Freq: Once | ORAL | Status: AC
  Administered 2019-07-02: 20 meq via ORAL
  Filled 2019-07-02: qty 1

## 2019-07-02 NOTE — TOC Initial Note (Signed)
Transition of Care [facility]) - Initial/Assessment Note    Patient Details  Name: Dennis Davidson MRN: [id number] Date of Birth: 16-Aug-1976  [facility] CM/SW Contact:    Davidson, Dennis Llamas Phone Number: [phone]  07/02/2019, 12:15 PM  Clinical Narrative: Clinical Social Worker ([place]) received consult for discharge planning. PT is recommending home health. CSW met with patient alone at bedside to discuss D/C plan. Patient was alert and oriented X4 and was sitting up in the chair at bedside. CSW introduced self and explained role of CSW department. Per patient he lives in [place] with his wife Dennis Davidson and requested a rolling walker. Per patient he believes that his wife ordered a bedside commode and he will confirm with her. CSW made [place] DME agency representative aware of the need for rolling walker. CSW presented home health list to patient from [email or web address] CSW made patient aware that patient's surgeon prefers [facility] for home health. Patient is agreeable to [facility] home health. Helene Kelp [facility] home [facility] representative is aware of above. CSW notified patient of Lovenox price $28.04. Patient requested for CSW to call his wife as well. CSW attempted to contact patient's wife Dennis Davidson however she did not answer and a voicemail was left. CSW will continue to follow and assist as needed.                 Expected Discharge Plan: [place] Barriers to Discharge: Continued Medical Work up   Patient Goals and CMS Choice Patient states their goals for this hospitalization and ongoing recovery are:: To feel better. CMS Medicare.gov Compare Post Acute Care list provided to:: Patient Choice offered to / list presented to : Patient  Expected Discharge Plan and Services Expected Discharge Plan: [place] In-house Referral: Clinical Social Work   Post Acute Care Choice: [place] arrangements for the past 2 months: [place] Expected Discharge Date: 07/04/19               DME Arranged: [place] Rile rolling DME Agency: [employer] Date DME Agency Contacted: 07/02/19 Time DME Agency Contacted: 1214 Representative spoke with at DME Agency: [place]: PT [place]: [facility] (formerly [employer]) Date [place]: 07/02/19 Time [place]: 1214 Representative spoke with at [place] Arrangements/Services Living arrangements for the past 2 months: [place] with:: Spouse Patient language and need for interpreter reviewed:: No Do you feel safe going back to the place where you live?: Yes      Need for Family Participation in Patient Care: No (Comment) Care giver support system in place?: Yes (comment)      Activities of Daily Living Home Assistive Devices/Equipment: Eyeglasses, Contact lenses, Blood pressure cuff, Cane (specify quad or straight) ADL Screening (condition at time of admission) Patient's cognitive ability adequate to safely complete daily activities?: Yes Is the patient deaf or have difficulty hearing?: No Does the patient have difficulty seeing, even when wearing glasses/contacts?: No Does the patient have difficulty concentrating, remembering, or making decisions?: No Patient able to express need for assistance with ADLs?: Yes Does the patient have difficulty dressing or bathing?: No Independently performs ADLs?: Yes (appropriate for developmental age) Does the patient have difficulty walking or climbing stairs?: Yes(d/t pain in hip) Weakness of Legs: Left Weakness of Arms/Hands: None  Permission Sought/Granted Permission sought to share information with : ([place] and DME agency.) Permission granted  to share information with : Yes, Verbal Permission Granted              Emotional Assessment Appearance:: Appears stated age   Affect (typically observed): Pleasant, Calm Orientation: : Oriented to  Self, Oriented to Place, Oriented to  Time, Oriented to Situation Alcohol / Substance Use: Not Applicable Psych Involvement: No (comment)  Admission diagnosis:  AVASCULAR NECROSIS OR LEFT HIP Patient Active Problem List   Diagnosis Date Noted  . Status post total hip replacement, left 07/01/2019  . Cervical myelopathy ([place]) 08/05/2018  . Arrhythmia 06/20/2017  . CAD in native artery 12/01/2016  . HTN, goal below 140/80 12/01/2016  . Mixed hyperlipidemia 12/01/2016  . Situational mixed anxiety and depressive disorder 12/01/2016   PCP:  Idelle Crouch, MD Pharmacy:   [facility] [address] [place] [address] Phone: [phone] Fax: [phone]  [place], [place] [address] [place] [address] Phone: [phone] Fax: [phone]     Social Determinants of Health (SDOH) Interventions    Readmission Risk Interventions No flowsheet data found.

## 2019-07-02 NOTE — Discharge Instructions (Signed)
ANTERIOR APPROACH TOTAL HIP REPLACEMENT POSTOPERATIVE DIRECTIONS   Hip Rehabilitation, Guidelines Following Surgery  The results of a hip operation are greatly improved after range of motion and muscle strengthening exercises. Follow all safety measures which are given to protect your hip. If any of these exercises cause increased pain or swelling in your joint, decrease the amount until you are comfortable again. Then slowly increase the exercises. Call your caregiver if you have problems or questions.   HOME CARE INSTRUCTIONS  Remove items at home which could result in a fall. This includes throw rugs or furniture in walking pathways.   ICE to the affected hip every three hours for 30 minutes at a time and then as needed for pain and swelling.  Continue to use ice on the hip for pain and swelling from surgery. You may notice swelling that will progress down to the foot and ankle.  This is normal after surgery.  Elevate the leg when you are not up walking on it.    Continue to use the breathing machine which will help keep your temperature down.  It is common for your temperature to cycle up and down following surgery, especially at night when you are not up moving around and exerting yourself.  The breathing machine keeps your lungs expanded and your temperature down.  Do not place pillow under knee, focus on keeping the knee straight while resting  DIET You may resume your previous home diet once your are discharged from the hospital.  DRESSING / WOUND CARE / SHOWERING The wound VAC will stay in place for up to a week after leaving the hospital.  When the wound VAC begins to be continuously, it can be removed.  Physical therapy can assist with this.  A new bandage will be applied. Keep your dressing dry with showering.  You can keep it covered and pat dry. Change the surgical dressing daily and reapply a dry dressing each time.  ACTIVITY Walk with your walker as instructed. Use walker as  long as suggested by your caregivers. Avoid periods of inactivity such as sitting longer than an hour when not asleep. This helps prevent blood clots.  You may resume a sexual relationship in one month or when given the OK by your doctor.  You may return to work once you are cleared by your doctor.  Do not drive a car for 6 weeks or until released by you surgeon.  Do not drive while taking narcotics.  WEIGHT BEARING Weight bearing as tolerated with assist device (walker, cane, etc) as directed, use it as long as suggested by your surgeon or therapist, typically at least 4-6 weeks.  POSTOPERATIVE CONSTIPATION PROTOCOL Constipation - defined medically as fewer than three stools per week and severe constipation as less than one stool per week.  One of the most common issues patients have following surgery is constipation.  Even if you have a regular bowel pattern at home, your normal regimen is likely to be disrupted due to multiple reasons following surgery.  Combination of anesthesia, postoperative narcotics, change in appetite and fluid intake all can affect your bowels.  In order to avoid complications following surgery, here are some recommendations in order to help you during your recovery period.  Colace (docusate) - Pick up an over-the-counter form of Colace or another stool softener and take twice a day as long as you are requiring postoperative pain medications.  Take with a full glass of water daily.  If you experience loose  stools or diarrhea, hold the colace until you stool forms back up.  If your symptoms do not get better within 1 week or if they get worse, check with your doctor.  Dulcolax (bisacodyl) - Pick up over-the-counter and take as directed by the product packaging as needed to assist with the movement of your bowels.  Take with a full glass of water.  Use this product as needed if not relieved by Colace only.   MiraLax (polyethylene glycol) - Pick up over-the-counter to have  on hand.  MiraLax is a solution that will increase the amount of water in your bowels to assist with bowel movements.  Take as directed and can mix with a glass of water, juice, soda, coffee, or tea.  Take if you go more than two days without a movement. Do not use MiraLax more than once per day. Call your doctor if you are still constipated or irregular after using this medication for 7 days in a row.  If you continue to have problems with postoperative constipation, please contact the office for further assistance and recommendations.  If you experience "the worst abdominal pain ever" or develop nausea or vomiting, please contact the office immediatly for further recommendations for treatment.  ITCHING  If you experience itching with your medications, try taking only a single pain pill, or even half a pain pill at a time.  You can also use Benadryl over the counter for itching or also to help with sleep.   TED HOSE STOCKINGS Wear the elastic stockings on both legs for three weeks following surgery during the day but you may remove then at night for sleeping.  MEDICATIONS See your medication summary on the After Visit Summary that the nursing staff will review with you prior to discharge.  You may have some home medications which will be placed on hold until you complete the course of blood thinner medication.  It is important for you to complete the blood thinner medication as prescribed by your surgeon.  Continue your approved medications as instructed at time of discharge.  PRECAUTIONS If you experience chest pain or shortness of breath - call 911 immediately for transfer to the hospital emergency department.  If you develop a fever greater that 101 F, purulent drainage from wound, increased redness or drainage from wound, foul odor from the wound/dressing, or calf pain - CONTACT YOUR SURGEON.                                                   FOLLOW-UP APPOINTMENTS Make sure you keep all of  your appointments after your operation with your surgeon and caregivers. You should call the office at the above phone number and make an appointment for approximately two weeks after the date of your surgery or on the date instructed by your surgeon outlined in the "After Visit Summary".  RANGE OF MOTION AND STRENGTHENING EXERCISES  These exercises are designed to help you keep full movement of your hip joint. Follow your caregiver's or physical therapist's instructions. Perform all exercises about fifteen times, three times per day or as directed. Exercise both hips, even if you have had only one joint replacement. These exercises can be done on a training (exercise) mat, on the floor, on a table or on a bed. Use whatever works the best and is most comfortable  for you. Use music or television while you are exercising so that the exercises are a pleasant break in your day. This will make your life better with the exercises acting as a break in routine you can look forward to.  Lying on your back, slowly slide your foot toward your buttocks, raising your knee up off the floor. Then slowly slide your foot back down until your leg is straight again.  Lying on your back spread your legs as far apart as you can without causing discomfort.  Lying on your side, raise your upper leg and foot straight up from the floor as far as is comfortable. Slowly lower the leg and repeat.  Lying on your back, tighten up the muscle in the front of your thigh (quadriceps muscles). You can do this by keeping your leg straight and trying to raise your heel off the floor. This helps strengthen the largest muscle supporting your knee.  Lying on your back, tighten up the muscles of your buttocks both with the legs straight and with the knee bent at a comfortable angle while keeping your heel on the floor.   IF YOU ARE TRANSFERRED TO A SKILLED REHAB FACILITY If the patient is transferred to a skilled rehab facility following release  from the hospital, a list of the current medications will be sent to the facility for the patient to continue.  When discharged from the skilled rehab facility, please have the facility set up the patient's [place] prior to being released. Also, the skilled facility will be responsible for providing the patient with their medications at time of release from the facility to include their pain medication, the muscle relaxants, and their blood thinner medication. If the patient is still at the rehab facility at time of the two week follow up appointment, the skilled rehab facility will also need to assist the patient in arranging follow up appointment in our office and any transportation needs.  MAKE SURE YOU:  Understand these instructions.  Get help right away if you are not doing well or get worse.    Pick up stool softner and laxative for home use following surgery while on pain medications. Do not submerge incision under water. Please use good hand washing techniques while changing dressing each day. May shower starting three days after surgery. Please use a clean towel to pat the incision dry following showers. Continue to use ice for pain and swelling after surgery. Do not use any lotions or creams on the incision until instructed by your surgeon.

## 2019-07-02 NOTE — Evaluation (Signed)
Occupational Therapy Evaluation Patient Details Name: Dennis CaffeyJoshua M Allbright MRN: [id number] DOB: 21-Apr-1976 Today's Date: 07/02/2019    History of Present Illness Patient is a 43 year old male admitted for L THA. Anterior approach. 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 Past sx hx: ACDF of C4-5, C5-6 07/2018 d/t cervical myelopathy.   Clinical Impression   Pt seen for OT evaluation this date, POD#1 from above surgery. Pt was independent in all ADLs prior to surgery, however occasionally using cane for fxl mobility d/t L hip pain. Pt is eager to return to PLOF with less pain and improved safety and independence. Pt currently requires MIN A for LB dressing with use of AE while in seated position due to pain and limited AROM of L hip. In addition, pt requires MIN A for ADL transfers and CGA for fxl mobility with FWW. Pt instructed in how to implement DME/AE for LB bathing and dressing tasks, fall prevention strategies, compression stocking mgt strategies, and transfer techniques. Pt could benefit from OT f/u in acute setting for increased AE training for LB ADLs, but do not anticipate need for OT f/u after d/c.      Follow Up Recommendations  No OT follow up    Equipment Recommendations  3 in 1 bedside commode    Recommendations for Other Services       Precautions / Restrictions Precautions Precautions: Fall;Anterior Hip Precaution Booklet Issued: No Restrictions Weight Bearing Restrictions: Yes LLE Weight Bearing: Weight bearing as tolerated      Mobility Bed Mobility      General bed mobility comments: Pt was up in chair when OT presents for evaluation  Transfers Overall transfer level: Needs assistance Equipment used: Rolling walker (2 wheeled) Transfers: Sit to/from Stand Sit to Stand: Min assist              Balance Overall balance assessment: Needs assistance Sitting-balance support: Single extremity supported;Feet supported Sitting balance-Leahy  Scale: Good     Standing balance support: Bilateral upper extremity supported Standing balance-Leahy Scale: Fair Standing balance comment: CGA for static standing                           ADL either performed or assessed with clinical judgement   ADL Overall ADL's : Needs assistance/impaired Eating/Feeding: Independent   Grooming: Wash/dry hands;Wash/dry face;Oral care;Set up;Min guard;Standing Grooming Details (indicate cue type and reason): CGA with FWW for standing sink-side for hygiene tasks. Upper Body Bathing: Set up;Sitting   Lower Body Bathing: Minimal assistance;Sitting/lateral leans   Upper Body Dressing : Set up;Sitting   Lower Body Dressing: Minimal assistance;With adaptive equipment;Sit to/from stand Lower Body Dressing Details (indicate cue type and reason): with FWW for standing clothing mgt Toilet Transfer: Min Nurse, learning disabilityguard;BSC;RW Toilet Transfer Details (indicate cue type and reason): BSC over standard toilet to elevate Toileting- Clothing Manipulation and Hygiene: Minimal assistance;Sit to/from stand Toileting - Clothing Manipulation Details (indicate cue type and reason): with use of grab bar/toilet rails Tub/ Shower Transfer: Min guard;Rolling walker   Functional mobility during ADLs: Min guard;Rolling walker General ADL Comments: Pt with some difficulty shifting weight for fxl mobility, bears weight through b/l UEs on FWW, demos a shuffling pattern d/t pain in L hip when offloading R foot to step.     Vision Baseline Vision/History: Wears glasses Wears Glasses: At all times Patient Visual Report: No change from baseline       Perception  Praxis      Pertinent Vitals/Pain Pain Assessment: 0-10 Pain Score: 10-Worst pain ever Pain Location: L hip and stomach d/t "acid reflux" per pt. Pain Descriptors / Indicators: Aching;Constant;Grimacing;Guarding;Sore Pain Intervention(s): Limited activity within patient's tolerance;Monitored during  session;Patient requesting pain meds-RN notified;RN gave pain meds during session     Hand Dominance Right   Extremity/Trunk Assessment Upper Extremity Assessment Upper Extremity Assessment: Overall WFL for tasks assessed;RUE deficits/detail;LUE deficits/detail RUE Deficits / Details: 5/5 MMT of shoudler flex/ext, elbow flex/ext, and grip LUE Deficits / Details: 5/5 MMT of shoudler flex/ext, elbow flex/ext, and grip   Lower Extremity Assessment Lower Extremity Assessment: Defer to PT evaluation;RLE deficits/detail;LLE deficits/detail RLE Deficits / Details: WFL LLE: Unable to fully assess due to pain LLE Sensation: WNL LLE Coordination: decreased fine motor;decreased gross motor   Cervical / Trunk Assessment Cervical / Trunk Assessment: Normal   Communication Communication Communication: No difficulties   Cognition Arousal/Alertness: Awake/alert Behavior During Therapy: WFL for tasks assessed/performed Overall Cognitive Status: Within Functional Limits for tasks assessed                                     General Comments       Exercises  Other Exercises Other Exercises: OT facilitates education with pt and wife re: adaptive equipment for LB dressing/bathing d/t pt with limited tolerance for bending at this time. Both parties verbalize understanding, but require f/u for teach-back. Other Exercises: OT facilitates education with pt and wife re: safety considerations and fall precautions including scanning environment for hazards prior to fxl mobility and use of pouch on walker for cell phone to ensure pt has communication device at all times. Other Exercises: OT facilites education re: WB recommendations (WBAT) and pt verbalized understanding.   Shoulder Instructions      Home Living Family/patient expects to be discharged to:: Private residence Living Arrangements: Spouse/significant other Available Help at Discharge: Family Type of Home: House Home  Access: Stairs to enter Technical brewer of Steps: 4 Entrance Stairs-Rails: Can reach both Home Layout: Two level Alternate Level Stairs-Number of Steps: 13   Bathroom Shower/Tub: Teacher, early years/pre: [place]: Cane - single point          Prior Functioning/Environment Level of Independence: Independent with assistive device(s)        Comments: was using SPC prior to surgery due to pain.        OT Problem List: Decreased strength;Decreased range of motion;Decreased activity tolerance;Impaired balance (sitting and/or standing);Decreased knowledge of use of DME or AE;Decreased knowledge of precautions;Pain      OT Treatment/Interventions: Self-care/ADL training;Therapeutic exercise;DME and/or AE instruction;Therapeutic activities;Patient/family education    OT Goals(Current goals can be found in the care plan section) Acute Rehab OT Goals Patient Stated Goal: to return home with wife, have decreased pain OT Goal Formulation: With patient Time For Goal Achievement: 07/16/19 Potential to Achieve Goals: Good  OT Frequency: Min 1X/week   Barriers to D/C:            Co-evaluation              AM-PAC OT "6 Clicks" Daily Activity     Outcome Measure Help from another person eating meals?: None Help from another person taking care of personal grooming?: A Little Help from another person toileting, which includes using toliet, bedpan, or urinal?: A Little Help from another  person bathing (including washing, rinsing, drying)?: A Little Help from another person to put on and taking off regular upper body clothing?: None Help from another person to put on and taking off regular lower body clothing?: A Little 6 Click Score: 20   End of Session Equipment Utilized During Treatment: Gait belt;Rolling walker Nurse Communication: Patient requests pain meds  Activity Tolerance: Patient tolerated treatment well Patient left: in  chair;with chair alarm set;with nursing/sitter in room;with family/visitor present;with SCD's reapplied  OT Visit Diagnosis: Unsteadiness on feet (R26.81);Muscle weakness (generalized) (M62.81)                Time: [id number] OT Time Calculation (min): 41 min Charges:  OT General Charges $OT Visit: 1 Visit OT Evaluation $OT Eval Low Complexity: 1 Low OT Treatments $Self Care/Home Management : 8-22 mins $Therapeutic Activity: 23-37 mins  Rejeana Brocklison Darnesha Diloreto, MS, OTR/L ascom [phone] or [phone] 07/02/19, 1:28 PM

## 2019-07-02 NOTE — Anesthesia Postprocedure Evaluation (Signed)
Anesthesia Post Note  Patient: Dennis Davidson  Procedure(s) Performed: LEFT TOTAL HIP ARTHROPLASTY (Left )  Patient location during evaluation: Nursing Unit Anesthesia Type: Spinal Level of consciousness: oriented and awake and alert Pain management: pain level controlled Vital Signs Assessment: post-procedure vital signs reviewed and stable Respiratory status: spontaneous breathing and respiratory function stable Cardiovascular status: blood pressure returned to baseline and stable Postop Assessment: no headache, no backache, no apparent nausea or vomiting and patient able to bend at knees Anesthetic complications: no     Last Vitals:  Vitals:   07/01/19 2339 07/02/19 0418  BP: (!) 151/99 (!) 145/89  Pulse: 90 76  Resp: 18 18  Temp: 36.6 C 36.8 C  SpO2: 95% 99%    Last Pain:  Vitals:   07/02/19 0020  TempSrc:   PainSc: 10-Worst pain ever                 Hedda Slade

## 2019-07-02 NOTE — TOC Progression Note (Signed)
Transition of Care [facility]) - Progression Note    Patient Details  Name: MAXAMUS COLAO MRN: [id number] Date of Birth: Jul 23, 1976  Transition of Care [facility]) CM/SW Contact  Jyaire Koudelka, Lenice Llamas Phone Number: [phone]  07/02/2019, 2:41 PM  Clinical Narrative: Clinical Social Worker (CSW) met with patient and his wife [name] was at bedside. Per wife patient will need a bedside commode. [facility] [employer] agency representative is aware of above and will deliver rolling walker and bedside commode to the room. CSW made wife aware that [facility] will provide home health and that Lovenox price is $28.04. Wife verbalized her understanding. CSW will continue to follow and assist as needed.        Expected Discharge Plan: [place] Barriers to Discharge: Continued Medical Work up  Expected Discharge Plan and Services Expected Discharge Plan: [place] In-house Referral: Clinical Social Work   Post Acute Care Choice: [place] arrangements for the past 2 months: [place] Expected Discharge Date: 07/04/19               DME Arranged: Gilford Rile rolling DME Agency: [employer] Date DME Agency Contacted: 07/02/19 Time DME Agency Contacted: 1214 Representative spoke with at DME Agency: Leroy [place]: PT Jeff: [facility] (formerly [employer]) Date [place]: 07/02/19 Time [place]: 1214 Representative spoke with at [place]: [place] ([place]) Interventions    Readmission Risk Interventions No flowsheet data found.

## 2019-07-02 NOTE — Evaluation (Signed)
Physical Therapy Evaluation Patient Details Name: Dennis Davidson MRN: [id number] DOB: 28-Nov-1975 Today's Date: 07/02/2019   History of Present Illness  Patient is a 43 year old male admitted for L THA. Anterior approach.  Clinical Impression  Patient received in bed, reports nausea or heartburn. RN came with meds while evaluation occurring. Patient requires min assist with bed mobility and transfers, min guard for ambulating in room 15 feet with RW. Patient requires cues for use of RW and L WB status. Patient tends to walk on toes on left and shuffles feet. Patient will benefit from continued skilled PT while in acute for improved functional mobility, safety and independence.         Follow Up Recommendations Home health PT    Equipment Recommendations  Rolling walker with 5" wheels;3in1 (PT)    Recommendations for Other Services       Precautions / Restrictions Precautions Precautions: Fall;Anterior Hip Precaution Booklet Issued: No Restrictions Weight Bearing Restrictions: Yes LLE Weight Bearing: Weight bearing as tolerated      Mobility  Bed Mobility Overal bed mobility: Needs Assistance Bed Mobility: Supine to Sit     Supine to sit: Min assist     General bed mobility comments: use of bed rails, increased time and effort required, Min assist to move L LE off bed and raise trunk to seated position.  Transfers Overall transfer level: Needs assistance Equipment used: Rolling walker (2 wheeled) Transfers: Sit to/from Stand Sit to Stand: Min assist;From elevated surface            Ambulation/Gait Ambulation/Gait assistance: Min guard Gait Distance (Feet): 15 Feet Assistive device: Rolling walker (2 wheeled) Gait Pattern/deviations: Step-to pattern;Decreased stance time - left;Decreased weight shift to left;Shuffle Gait velocity: decreased Gait velocity interpretation: <1.31 ft/sec, indicative of household ambulator General Gait Details: patient requires cues  to put foot flat on left with weight bearing. Initially walking on toes on left side. Cues for safety/use of AD  Stairs            Wheelchair Mobility    Modified Rankin (Stroke Patients Only)       Balance Overall balance assessment: Needs assistance Sitting-balance support: Single extremity supported;Feet supported Sitting balance-Leahy Scale: Good     Standing balance support: Bilateral upper extremity supported Standing balance-Leahy Scale: Fair                               Pertinent Vitals/Pain Pain Assessment: 0-10 Pain Score: 6  Pain Descriptors / Indicators: Aching;Operative site guarding;Guarding;Grimacing Pain Intervention(s): Monitored during session;Limited activity within patient's tolerance;Repositioned    Home Living Family/patient expects to be discharged to:: Private residence Living Arrangements: Spouse/significant other Available Help at Discharge: Family Type of Home: House         Home Equipment: Kasandra Knudsen - single point      Prior Function Level of Independence: Independent with assistive device(s)         Comments: was using SPC prior to surgery due to pain.     Hand Dominance        Extremity/Trunk Assessment   Upper Extremity Assessment Upper Extremity Assessment: Overall WFL for tasks assessed    Lower Extremity Assessment Lower Extremity Assessment: LLE deficits/detail LLE: Unable to fully assess due to pain LLE Sensation: WNL LLE Coordination: decreased fine motor;decreased gross motor       Communication   Communication: No difficulties  Cognition Arousal/Alertness: Awake/alert Behavior During Therapy:  WFL for tasks assessed/performed Overall Cognitive Status: Within Functional Limits for tasks assessed                                        General Comments      Exercises Total Joint Exercises Ankle Circles/Pumps: AROM;10 reps;Both Quad Sets: AROM;Both;10 reps Gluteal Sets:  AROM;10 reps;Both   Assessment/Plan    PT Assessment Patient needs continued PT services  PT Problem List Decreased strength;Decreased mobility;Decreased range of motion;Decreased activity tolerance;Pain;Decreased balance;Decreased knowledge of use of DME;Decreased knowledge of precautions       PT Treatment Interventions DME instruction;Therapeutic activities;Gait training;Therapeutic exercise;Patient/family education;Functional mobility training;Balance training;Stair training    PT Goals (Current goals can be found in the Care Plan section)  Acute Rehab PT Goals Patient Stated Goal: to return home with wife, have decreased pain PT Goal Formulation: With patient Time For Goal Achievement: 07/04/19 Potential to Achieve Goals: Good    Frequency BID   Barriers to discharge        Co-evaluation               AM-PAC PT "6 Clicks" Mobility  Outcome Measure Help needed turning from your back to your side while in a flat bed without using bedrails?: A Little Help needed moving from lying on your back to sitting on the side of a flat bed without using bedrails?: A Little Help needed moving to and from a bed to a chair (including a wheelchair)?: A Little Help needed standing up from a chair using your arms (e.g., wheelchair or bedside chair)?: A Little Help needed to walk in hospital room?: A Little Help needed climbing 3-5 steps with a railing? : A Little 6 Click Score: 18    End of Session Equipment Utilized During Treatment: Gait belt Activity Tolerance: Patient limited by pain Patient left: in chair;with chair alarm set;with call bell/phone within reach Nurse Communication: Mobility status PT Visit Diagnosis: Muscle weakness (generalized) (M62.81);Difficulty in walking, not elsewhere classified (R26.2);Pain Pain - Right/Left: Left Pain - part of body: Hip    Time: [id number] PT Time Calculation (min) (ACUTE ONLY): 27 min   Charges:   PT Evaluation $PT Eval Moderate  Complexity: 1 Mod PT Treatments $Gait Training: 8-22 mins        Amel Kitch, PT, GCS 07/02/19,10:29 AM

## 2019-07-02 NOTE — TOC Benefit Eligibility Note (Signed)
Transition of Care [facility]) Benefit Eligibility Note    Patient Details  Name: Dennis Davidson MRN: [id number] Date of Birth: 07-01-1976   Medication/Dose: Enoxaparin 42m once daily for 14 days  Covered?: Yes   Prescription Coverage [facility]  Spoke with Person/Company/Phone Number:: VMila Merryat CWebbat [phone] Co-Pay: $28.04 estimated copay  Prior Approval: No  Deductible: (No deductible.  Has max out of pocket of $4,000, of which $2,810.05 met as of time of call.)   HDannette BarbaraPhone Number: [phone] [phone]/03/2019, 8:58 AM

## 2019-07-02 NOTE — Progress Notes (Signed)
  Subjective: 1 Day Post-Op Procedure(s) (LRB): LEFT TOTAL HIP ARTHROPLASTY (Left) Patient reports pain as moderate.   Patient seen in rounds with Dr. Rudene Christians. Patient is well, and has had no acute complaints or problems Plan is to go Home after hospital stay. Negative for chest pain and shortness of breath Fever: no Gastrointestinal: Negative for nausea and vomiting  Objective: Vital signs in last 24 hours: Temp:  [97.6 F (36.4 C)-99 F (37.2 C)] 98.2 F (36.8 C) (08/05 0418) Pulse Rate:  [65-90] 76 (08/05 0418) Resp:  [11-20] 18 (08/05 0418) BP: (109-151)/(64-99) 145/89 (08/05 0418) SpO2:  [95 %-100 %] 99 % (08/05 0418) Weight:  [79.8 kg] 79.8 kg (08/04 0851)  Intake/Output from previous day:  Intake/Output Summary (Last 24 hours) at 07/02/2019 0631 Last data filed at 07/02/2019 0422 Gross per 24 hour  Intake 2096.67 ml  Output 1875 ml  Net 221.67 ml    Intake/Output this shift: Total I/O In: 936.7 [I.V.:736.7; IV Piggyback:200] Out: 1625 [Urine:1625]  Labs: Recent Labs    07/01/19 0842 07/01/19 1416  HGB 10.9* 10.4*   Recent Labs    07/01/19 0842 07/01/19 1416  WBC  --  7.2  RBC  --  3.25*  HCT 32.0* 33.8*  PLT  --  147*   Recent Labs    07/01/19 0842 07/01/19 1416 07/02/19 0321  NA 134*  --  131*  K 3.6  --  3.4*  CL  --   --  103  CO2  --   --  21*  BUN  --   --  8  CREATININE  --  0.88 0.80  GLUCOSE 97  --  115*  CALCIUM  --   --  8.2*   No results for input(s): LABPT, INR in the last 72 hours.   EXAM General - Patient is Alert and Oriented Extremity - Neurovascular intact Sensation intact distally Compartment soft Dressing/Incision - clean, dry, with the wound VAC intact Motor Function - intact, moving foot and toes well on exam.   Past Medical History:  Diagnosis Date  . Anxiety   . Arthritis   . Dysrhythmia    slight irregularity  . Headache    secondary to bulging disc. better since surgery  . Hypertension      Assessment/Plan: 1 Day Post-Op Procedure(s) (LRB): LEFT TOTAL HIP ARTHROPLASTY (Left) Active Problems:   Status post total hip replacement, left  Estimated body mass index is 27.55 kg/m as calculated from the following:   Height as of this encounter: 5\' 7"  (1.702 m).   Weight as of this encounter: 79.8 kg. Advance diet Up with therapy D/C IV fluids Discharge home with home health planned for tomorrow or Friday  DVT Prophylaxis - Lovenox, Foot Pumps and TED hose Weight-Bearing as tolerated to left leg  Reche Dixon, PA-C Orthopaedic Surgery 07/02/2019, 6:31 AM

## 2019-07-02 NOTE — Progress Notes (Signed)
Physical Therapy Treatment Patient Details Name: Dennis CaffeyJoshua M Rockford MRN: [id number] DOB: 02-17-1976 Today's Date: 07/02/2019    History of Present Illness Patient is a 43 year old male admitted for L THA. Anterior approach. 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 Past sx hx: ACDF of C4-5, C5-6 07/2018 d/t cervical myelopathy.    PT Comments    Patient received in recliner, wife present this pm. Patient reports he is feeling better since this morning. Patient progressing with mobility, able to walk 40 feet with RW and min guard/supervision. Cues needed for sequencing, quality of gait. Patient will continue to benefit from skilled PT to improve mobility, safety, functional independence.     Follow Up Recommendations  Home health PT     Equipment Recommendations  Rolling walker with 5" wheels    Recommendations for Other Services       Precautions / Restrictions Precautions Precautions: Fall;Anterior Hip Precaution Booklet Issued: No Restrictions Weight Bearing Restrictions: Yes LLE Weight Bearing: Weight bearing as tolerated    Mobility  Bed Mobility               General bed mobility comments: Not assessed this pm, patient remained up in the recliner  Transfers Overall transfer level: Modified independent Equipment used: Rolling walker (2 wheeled) Transfers: Sit to/from Stand Sit to Stand: Min guard         General transfer comment: cues for sequencing, hand placement  Ambulation/Gait Ambulation/Gait assistance: Min guard Gait Distance (Feet): 40 Feet Assistive device: Rolling walker (2 wheeled) Gait Pattern/deviations: Step-to pattern;Decreased weight shift to left Gait velocity: decreased   General Gait Details: cues needed to step with left LE instead of scooting it forward. Cues for sequencing with RW.   Stairs             Wheelchair Mobility    Modified Rankin (Stroke Patients Only)       Balance Overall balance  assessment: Modified Independent Sitting-balance support: No upper extremity supported Sitting balance-Leahy Scale: Good     Standing balance support: Bilateral upper extremity supported Standing balance-Leahy Scale: Good Standing balance comment: CGA for static standing                            Cognition Arousal/Alertness: Awake/alert Behavior During Therapy: WFL for tasks assessed/performed Overall Cognitive Status: Within Functional Limits for tasks assessed                                        Exercises Total Joint Exercises Ankle Circles/Pumps: AROM;10 reps;Both Quad Sets: AROM;10 reps;Both Heel Slides: AAROM;Left;10 reps Hip ABduction/ADduction: AAROM;10 reps;Left Long Arc Quad: AROM;Left;10 reps Other Exercises Other Exercises: OT facilitates education with pt and wife re: adaptive equipment for LB dressing/bathing d/t pt with limited tolerance for bending at this time. Both parties verbalize understanding, but require f/u for teach-back. Other Exercises: OT facilitates education with pt and wife re: safety considerations and fall precautions including scanning environment for hazards prior to fxl mobility and use of pouch on walker for cell phone to ensure pt has communication device at all times. Other Exercises: OT facilites education re: WB recommendations (WBAT) and pt verbalized understanding.    General Comments        Pertinent Vitals/Pain Pain Assessment: Faces Pain Score: 10-Worst pain ever Faces Pain Scale: Hurts a little bit Pain  Location: L hip Pain Descriptors / Indicators: Burning;Discomfort;Sore;Operative site guarding Pain Intervention(s): Monitored during session;Repositioned    Home Living Family/patient expects to be discharged to:: Private residence Living Arrangements: Spouse/significant other Available Help at Discharge: Family Type of Home: House Home Access: Stairs to enter Entrance Stairs-Rails: Can reach  both Home Layout: Two level Home Equipment: Cane - single point      Prior Function Level of Independence: Independent with assistive device(s)      Comments: was using SPC prior to surgery due to pain.   PT Goals (current goals can now be found in the care plan section) Acute Rehab PT Goals Patient Stated Goal: to return home with wife, have decreased pain PT Goal Formulation: With patient Time For Goal Achievement: 07/04/19 Potential to Achieve Goals: Good Progress towards PT goals: Progressing toward goals    Frequency    BID      PT Plan Current plan remains appropriate    Co-evaluation              AM-PAC PT "6 Clicks" Mobility   Outcome Measure  Help needed turning from your back to your side while in a flat bed without using bedrails?: A Little Help needed moving from lying on your back to sitting on the side of a flat bed without using bedrails?: A Little Help needed moving to and from a bed to a chair (including a wheelchair)?: A Little Help needed standing up from a chair using your arms (e.g., wheelchair or bedside chair)?: A Little Help needed to walk in hospital room?: A Little Help needed climbing 3-5 steps with a railing? : A Little 6 Click Score: 18    End of Session Equipment Utilized During Treatment: Gait belt Activity Tolerance: Patient tolerated treatment well Patient left: in chair;with call bell/phone within reach;with family/visitor present Nurse Communication: Mobility status PT Visit Diagnosis: Muscle weakness (generalized) (M62.81);Difficulty in walking, not elsewhere classified (R26.2);Pain Pain - Right/Left: Left Pain - part of body: Hip     Time: 1400-1425 PT Time Calculation (min) (ACUTE ONLY): 25 min  Charges:  $Gait Training: 8-22 mins $Therapeutic Exercise: 8-22 mins                     Amor Hyle, PT, GCS 07/02/19,2:41 PM

## 2019-07-03 LAB — CBC
HCT: 31.9 % — ABNORMAL LOW (ref 39.0–52.0)
Hemoglobin: 10.2 g/dL — ABNORMAL LOW (ref 13.0–17.0)
MCH: 31.9 pg (ref 26.0–34.0)
MCHC: 32 g/dL (ref 30.0–36.0)
MCV: 99.7 fL (ref 80.0–100.0)
Platelets: 164 10*3/uL (ref 150–400)
RBC: 3.2 MIL/uL — ABNORMAL LOW (ref 4.22–5.81)
RDW: 16.4 % — ABNORMAL HIGH (ref 11.5–15.5)
WBC: 10.1 10*3/uL (ref 4.0–10.5)
nRBC: 0 % (ref 0.0–0.2)

## 2019-07-03 LAB — BASIC METABOLIC PANEL
Anion gap: 9 (ref 5–15)
BUN: 7 mg/dL (ref 6–20)
CO2: 23 mmol/L (ref 22–32)
Calcium: 8.4 mg/dL — ABNORMAL LOW (ref 8.9–10.3)
Chloride: 99 mmol/L (ref 98–111)
Creatinine, Ser: 0.73 mg/dL (ref 0.61–1.24)
GFR calc Af Amer: >60 mL/min (ref >60)
GFR calc non Af Amer: >60 mL/min (ref >60)
Glucose, Bld: 150 mg/dL — ABNORMAL HIGH (ref 70–99)
Potassium: 4.1 mmol/L (ref 3.5–5.1)
Sodium: 131 mmol/L — ABNORMAL LOW (ref 135–145)

## 2019-07-03 MED ORDER — HYDROCODONE-ACETAMINOPHEN 5-325 MG PO TABS: 1.0000 | ORAL_TABLET | ORAL | 0 refills | Status: DC | PRN

## 2019-07-03 MED ORDER — TRAMADOL HCL 50 MG PO TABS: 50.0000 mg | ORAL_TABLET | Freq: Four times a day (QID) | ORAL | 0 refills | Status: DC

## 2019-07-03 MED ORDER — ENOXAPARIN SODIUM 40 MG/0.4ML ~~LOC~~ SOLN: 40.0000 mg | SUBCUTANEOUS | 0 refills | Status: DC

## 2019-07-03 NOTE — Discharge Summary (Signed)
Physician Discharge Summary  Patient ID: Dennis CaffeyJoshua M Davidson MRN: [id number] DOB/AGE: [birth year] 42 y.o.  Admit date: 07/01/2019 Discharge date: 07/03/2019  Admission Diagnoses:  AVN of the left hip  Discharge Diagnoses: Patient Active Problem List   Diagnosis Date Noted  . Status post total hip replacement, left 07/01/2019  . Cervical myelopathy (HCC) 08/05/2018  . Arrhythmia 06/20/2017  . CAD in native artery 12/01/2016  . HTN, goal below 140/80 12/01/2016  . Mixed hyperlipidemia 12/01/2016  . Situational mixed anxiety and depressive disorder 12/01/2016    Past Medical History:  Diagnosis Date  . Anxiety   . Arthritis   . Dysrhythmia    slight irregularity  . Headache    secondary to bulging disc. better since surgery  . Hypertension      Transfusion: None.   Consultants (if any):   Discharged Condition: Improved  Hospital Course: Dennis CaffeyJoshua M Mclennan is an 43 y.o. male who was admitted 07/01/2019 with a diagnosis of avascular necrosis of the left hip and went to the operating room on 07/01/2019 and underwent the above named procedures.    Surgeries: Procedure(s): LEFT TOTAL HIP ARTHROPLASTY on 07/01/2019 Patient tolerated the surgery well. Taken to PACU where she was stabilized and then transferred to the orthopedic floor.  Started on Lovenox 40mg  q 24 hrs. Foot pumps applied bilaterally at 80 mm. Heels elevated on bed with rolled towels. No evidence of DVT. Negative Homan. Physical therapy started on day #1 for gait training and transfer. OT started day #1 for ADL and assisted devices.  Patient's IV was removed on POD2, Foley removed on POD1.  Implants: Medacta AMIS 3 standard, S ceramic head 28 mm, 56 mm Mpact DM cup and liner  He was given perioperative antibiotics:  Anti-infectives (From admission, onward)   Start     Dose/Rate Route Frequency Ordered Stop   07/01/19 1700  ceFAZolin (ANCEF) IVPB 2g/100 mL premix     2 g 200 mL/hr over 30 Minutes Intravenous Every 6 hours  07/01/19 1338 07/02/19 0527   07/01/19 0830  ceFAZolin (ANCEF) IVPB 2g/100 mL premix     2 g 200 mL/hr over 30 Minutes Intravenous  Once 07/01/19 0816 07/01/19 1045   07/01/19 0818  ceFAZolin (ANCEF) 2-4 GM/100ML-% IVPB    Note to Pharmacy: Mike CrazeHolmes, Stephen   : cabinet override      07/01/19 0818 07/01/19 1045    .  He was given sequential compression devices, early ambulation, and Lovenox for DVT prophylaxis.  He benefited maximally from the hospital stay and there were no complications.    Recent vital signs:  Vitals:   07/03/19 0418 07/03/19 0824  BP: 129/76 123/85  Pulse: (!) 103 (!) 104  Resp:  16  Temp:  98.3 F (36.8 C)  SpO2:  100%   Recent laboratory studies:  Lab Results  Component Value Date   HGB 10.4 (L) 07/01/2019   HGB 10.9 (L) 07/01/2019   HGB 11.9 (L) 06/26/2019   Lab Results  Component Value Date   WBC 7.2 07/01/2019   PLT 147 (L) 07/01/2019   Lab Results  Component Value Date   INR 1.0 06/26/2019   Lab Results  Component Value Date   NA 131 (L) 07/02/2019   K 3.4 (L) 07/02/2019   CL 103 07/02/2019   CO2 21 (L) 07/02/2019   BUN 8 07/02/2019   CREATININE 0.80 07/02/2019   GLUCOSE 115 (H) 07/02/2019   Discharge Medications:   Allergies as of 07/03/2019  No Known Allergies     Medication List    STOP taking these medications   oxyCODONE 5 MG immediate release tablet Commonly known as: Oxy IR/ROXICODONE     TAKE these medications   ALPRAZolam 0.5 MG tablet Commonly known as: XANAX Take 0.5 mg by mouth 3 (three) times daily as needed for anxiety.   atorvastatin 80 MG tablet Commonly known as: LIPITOR Take 80 mg by mouth every evening.   cetirizine 10 MG tablet Commonly known as: ZYRTEC Take 10 mg by mouth daily as needed for allergies.   cyclobenzaprine 10 MG tablet Commonly known as: FLEXERIL Take 1 tablet (10 mg total) by mouth 3 (three) times daily as needed. What changed:   when to take this  reasons to take this    doxylamine (Sleep) 25 MG tablet Commonly known as: UNISOM Take 25 mg by mouth at bedtime as needed.   enoxaparin 40 MG/0.4ML injection Commonly known as: LOVENOX Inject 0.4 mLs (40 mg total) into the skin daily.   escitalopram 20 MG tablet Commonly known as: LEXAPRO Take 20 mg by mouth daily.   HYDROcodone-acetaminophen 5-325 MG tablet Commonly known as: NORCO/VICODIN Take 1-2 tablets by mouth every 4 (four) hours as needed for moderate pain.   lisinopril-hydrochlorothiazide 10-12.5 MG tablet Commonly known as: ZESTORETIC TAKE ONE TABLET EVERY DAY   methocarbamol 500 MG tablet Commonly known as: ROBAXIN Take 1 tablet (500 mg total) by mouth every 6 (six) hours as needed for muscle spasms.   metoprolol tartrate 25 MG tablet Commonly known as: LOPRESSOR Take 25 mg by mouth 2 (two) times daily.   multivitamin with minerals Tabs tablet Take 1 tablet by mouth daily.   traMADol 50 MG tablet Commonly known as: ULTRAM Take 1-2 tablets (50-100 mg total) by mouth every 6 (six) hours.            Durable Medical Equipment  (From admission, onward)         Start     Ordered   07/01/19 1339  DME Walker rolling  Once    Question:  Patient needs a walker to treat with the following condition  Answer:  Status post total hip replacement, left   07/01/19 1338   07/01/19 1339  DME 3 n 1  Once     07/01/19 1338   07/01/19 1339  DME Bedside commode  Once    Question:  Patient needs a bedside commode to treat with the following condition  Answer:  Status post total hip replacement, left   07/01/19 1338         Diagnostic Studies: Mr Hip Left Wo Contrast  Result Date: 06/11/2019 CLINICAL DATA:  Left hip pain since mid March 2020. EXAM: MR OF THE LEFT HIP WITHOUT CONTRAST TECHNIQUE: Multiplanar, multisequence MR imaging was performed. No intravenous contrast was administered. COMPARISON:  None. FINDINGS: Bones: No hip fracture or dislocation. Serpiginous signal abnormality in the  left femoral head and acetabulum without articular surface collapse consistent with avascular necrosis and severe surrounding marrow edema. Serpiginous signal abnormality in the right femoral head consistent with a vascular necrosis without articular surface collapse or significant surrounding marrow edema. Normal sacrum and sacroiliac joints. No SI joint widening or erosive changes. Articular cartilage and labrum Articular cartilage: Extensive full-thickness cartilage loss of the left femoral head and acetabulum. Labrum:  Left labral degeneration. Joint or bursal effusion Joint effusion: Small left hip joint effusion with synovitis. No right hip joint effusion. No SI joint effusion. Bursae:  No bursa formation. Muscles and tendons Flexors: Normal. Extensors: Normal. Abductors: Normal. Adductors: Normal. Gluteals: Normal. Hamstrings: Normal. Other findings Miscellaneous: No pelvic free fluid. No fluid collection or hematoma. No inguinal lymphadenopathy. No inguinal hernia. IMPRESSION: 1. Avascular necrosis of left femoral head with severe surrounding marrow edema extending into the femoral neck. No articular surface collapse. 2. Severe osteoarthritis of the left hip. 3. Mild avascular necrosis of the right femoral head without significant surrounding marrow edema or articular surface collapse. Electronically Signed   By: Elige KoHetal  Patel   On: 06/11/2019 13:13   Dg Hip Operative Unilat W Or W/o Pelvis Left  Result Date: 07/01/2019 CLINICAL DATA:  LEFT anterior hip replacement EXAM: OPERATIVE LEFT HIP (WITH PELVIS IF PERFORMED) 2 VIEWS TECHNIQUE: Fluoroscopic spot image(s) were submitted for interpretation post-operatively. COMPARISON:  MR LEFT hip 06/10/2019 FLUOROSCOPY TIME:  0 minutes 18 seconds Dose: 5.9 mGy FINDINGS: Bones demineralized. Acetabular and femoral components of a LEFT hip prosthesis were placed. No fracture or dislocation identified, though the distal aspect of the femoral component is not imaged on  this exam. IMPRESSION: LEFT hip prosthesis without acute complication with limitations of exam as above. Electronically Signed   By: Ulyses SouthwardMark  Boles M.D.   On: 07/01/2019 12:10   Dg Hip Unilat W Or W/o Pelvis 2-3 Views Left  Result Date: 07/01/2019 CLINICAL DATA:  Status post total left hip arthroplasty. EXAM: DG HIP (WITH OR WITHOUT PELVIS) 2-3V LEFT COMPARISON:  MRI 06/10/2019 FINDINGS: Well seated components of a total left hip arthroplasty. No complicating features are identified. IMPRESSION: Well seated components of a total left hip arthroplasty. Electronically Signed   By: Rudie MeyerP.  Gallerani M.D.   On: 07/01/2019 12:49   Disposition: Plan for possible discharge home today pending progress with PT.  Follow-up Information    Evon SlackGaines, Thomas C, PA-C. Go in 2 week(s).   Specialties: Orthopedic Surgery, Emergency Medicine Contact information: [address] [facility] [place] [id number] [phone]          Signed: Meriel PicaJames L Khadeeja Elden PA-C 07/03/2019, 8:33 AM

## 2019-07-03 NOTE — Progress Notes (Signed)
Physical Therapy Treatment Patient Details Name: Dennis CaffeyJoshua M Davidson MRN: [id number] DOB: Sep 08, 1976 Today's Date: 07/03/2019    History of Present Illness Patient is a 43 year old male admitted for L THA. Anterior approach. 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 Past sx hx: ACDF of C4-5, C5-6 07/2018 d/t cervical myelopathy.    PT Comments    Pt agreeable to PT; reports 6/10 pain with movement/activity and minimal at rest. Pt educated on all expected activity, progression of activity, anterior hip precautions and as it applies to function and exercises. Bed mobility with education for sequence and self assist; demonstrates learning and performs with significant increased time supine to sit with Mod I. STS with Mod I with education on stand weight shift prior to ambulation to allow LLE to neutral hip and knee extension, as pt tends to bear most weight on RLE and maintain LLE in hip/knee flexion. Education required for proper 3 point sequence for gait; demonstrates learning, but requires increased focus and significant time at this point; Min guard. Heavy reliance on rolling walker and continues to have decreased hip knee flexion on L. Stair training complete performed sideways with 2 hands on L rail; performs well with Min guard with education. Pt received up in chair; pt/spouse questions answered to their satisfaction. Pt prepared to discharge home with HHPT.    Follow Up Recommendations  Home health PT     Equipment Recommendations  Rolling walker with 5" wheels    Recommendations for Other Services       Precautions / Restrictions Precautions Precautions: Fall;Anterior Hip Restrictions Weight Bearing Restrictions: Yes LLE Weight Bearing: Weight bearing as tolerated    Mobility  Bed Mobility Overal bed mobility: Needs Assistance Bed Mobility: Supine to Sit     Supine to sit: Supervision     General bed mobility comments: requires sequence cues and educated on self  assist.  Transfers Overall transfer level: Modified independent Equipment used: Rolling walker (2 wheeled) Transfers: Sit to/from Stand Sit to Stand: Min guard         General transfer comment: able to do from low position, cues for use of LLE to tolerance   Ambulation/Gait Ambulation/Gait assistance: Min guard Gait Distance (Feet): 110 Feet Assistive device: Rolling walker (2 wheeled) Gait Pattern/deviations: Step-through pattern(partial step through) Gait velocity: decreased Gait velocity interpretation: <1.31 ft/sec, indicative of household ambulator General Gait Details: Initial step to with RLE trailing behind. Education for 3 point sequence, rw placement and appropriate step length for improved rhthym/pattern. Demonstrates learning.    Stairs Stairs: Yes Stairs assistance: Min guard Stair Management: One rail Left;Sideways Number of Stairs: 4 General stair comments: Education provided on proper sequence; pt demonstrated learning. Spouse also educated   Engineer, drillingWheelchair Mobility    Modified Rankin (Stroke Patients Only)       Balance Overall balance assessment: Modified Independent                                          Cognition Arousal/Alertness: Awake/alert Behavior During Therapy: WFL for tasks assessed/performed Overall Cognitive Status: Within Functional Limits for tasks assessed                                        Exercises Total Joint Exercises Ankle Circles/Pumps: AROM;Both;20 reps  Quad Sets: Strengthening;Both;10 reps;Standing(performed with weight shifting) Gluteal Sets: Strengthening;Both;10 reps Hip ABduction/ADduction: Strengthening;Left;10 reps;Standing Straight Leg Raises: Strengthening;Left;10 reps;Standing Long Arc Quad: Strengthening;Left;10 reps;Seated Marching in Standing: Strengthening;Left;10 reps;Standing Other Exercises Other Exercises: Pt educated on anterior hip precautions as it applies to  function. Re educated with pt and spouse when spouse arrived Other Exercises: Pt educated on activity level, conservative progression. Reviewed all with spouse present.  Other Exercises: Educated pt/spouse on car transfer. Other Exercises: All questions answered to pt/spouse satisfaction.    General Comments        Pertinent Vitals/Pain Pain Assessment: 0-10 Pain Score: 6  Pain Location: L hip Pain Descriptors / Indicators: Burning;Discomfort;Sore;Operative site guarding Pain Intervention(s): Monitored during session;Premedicated before session;Repositioned    Home Living                      Prior Function            PT Goals (current goals can now be found in the care plan section) Progress towards PT goals: Progressing toward goals    Frequency    BID      PT Plan Current plan remains appropriate    Co-evaluation              AM-PAC PT "6 Clicks" Mobility   Outcome Measure  Help needed turning from your back to your side while in a flat bed without using bedrails?: A Little Help needed moving from lying on your back to sitting on the side of a flat bed without using bedrails?: None Help needed moving to and from a bed to a chair (including a wheelchair)?: A Little Help needed standing up from a chair using your arms (e.g., wheelchair or bedside chair)?: A Little Help needed to walk in hospital room?: A Little Help needed climbing 3-5 steps with a railing? : A Little 6 Click Score: 19    End of Session Equipment Utilized During Treatment: Gait belt Activity Tolerance: Patient tolerated treatment well Patient left: in chair;with call bell/phone within reach;with family/visitor present   PT Visit Diagnosis: Muscle weakness (generalized) (M62.81);Difficulty in walking, not elsewhere classified (R26.2);Pain Pain - Right/Left: Left Pain - part of body: Hip     Time: 6578-4696 PT Time Calculation (min) (ACUTE ONLY): 60 min  Charges:  $Gait  Training: 23-37 mins $Therapeutic Exercise: 23-37 mins                     Larae Grooms, PTA 07/03/2019, 11:33 AM

## 2019-07-03 NOTE — TOC Transition Note (Signed)
Transition of Care [facility]) - CM/SW Discharge Note   Patient Details  Name: Dennis Davidson MRN: [id number] Date of Birth: 1976/03/20  [facility] CM/SW Contact:  Phuong Hillary, Lenice Llamas Phone Number: [phone]  07/03/2019, 8:52 AM   Clinical Narrative: Clinical Social Worker (CSW) notified Helene Kelp [facility] home health representative that patient will D/C home today. Per [place] [employer] D[employer] agency representative a rolling walker and a bedside commode have been delivered to patient's room. Patient is aware of Lovenox price $28.04. Please reconsult if future social work needs arise. CSW signing off.        Final next level of care: [place] Barriers to Discharge: Barriers Resolved   Patient Goals and CMS Choice Patient states their goals for this hospitalization and ongoing recovery are:: To feel better. CMS Medicare.gov Compare Post Acute Care list provided to:: Patient Choice offered to / list presented to : Patient  Discharge Placement                       Discharge Plan and Services In-house Referral: Clinical Social Work   Post Acute Care Choice: Home Health          DME Arranged: Bedside commode, Walker rolling DME Agency: [employer] Date DME Agency Contacted: 07/03/19 Time DME Agency Contacted: [phone] Representative spoke with at DME Agency: [place]: PT [place]: [facility] (formerly [employer]) Date [place]: 07/03/19 Time [place]: [phone] Representative spoke with at [place]: [place] ([place]) Interventions     Readmission Risk Interventions No flowsheet data found.

## 2019-07-03 NOTE — Progress Notes (Signed)
Subjective: 2 Days Post-Op Procedure(s) (LRB): LEFT TOTAL HIP ARTHROPLASTY (Left) Patient reports pain as moderate.   Patient is well, and has had no acute complaints or problems  Did have increase in BP last night, stable this morning, 123/85. Plan is to go Home after hospital stay. Walked 40 feet with PT yesterday. Negative for chest pain and shortness of breath Fever: no Gastrointestinal: Negative for nausea and vomiting  Objective: Vital signs in last 24 hours: Temp:  [97.8 F (36.6 C)-98.4 F (36.9 C)] 98.3 F (36.8 C) (08/06 0824) Pulse Rate:  [86-104] 104 (08/06 0824) Resp:  [16-19] 16 (08/06 0824) BP: (123-178)/(76-116) 123/85 (08/06 0824) SpO2:  [98 %-100 %] 100 % (08/06 0824)  Intake/Output from previous day:  Intake/Output Summary (Last 24 hours) at 07/03/2019 0828 Last data filed at 07/02/2019 2338 Gross per 24 hour  Intake 1020 ml  Output 660 ml  Net 360 ml    Intake/Output this shift: No intake/output data recorded.  Labs: Recent Labs    07/01/19 0842 07/01/19 1416  HGB 10.9* 10.4*   Recent Labs    07/01/19 0842 07/01/19 1416  WBC  --  7.2  RBC  --  3.25*  HCT 32.0* 33.8*  PLT  --  147*   Recent Labs    07/01/19 0842 07/01/19 1416 07/02/19 0321  NA 134*  --  131*  K 3.6  --  3.4*  CL  --   --  103  CO2  --   --  21*  BUN  --   --  8  CREATININE  --  0.88 0.80  GLUCOSE 97  --  115*  CALCIUM  --   --  8.2*   No results for input(s): LABPT, INR in the last 72 hours.   EXAM General - Patient is Alert and Oriented Extremity - Neurovascular intact Sensation intact distally Intact pulses distally Dorsiflexion/Plantar flexion intact No cellulitis present Compartment soft Dressing/Incision - clean, dry, with the wound VAC intact Motor Function - intact, moving foot and toes well on exam.   Past Medical History:  Diagnosis Date  . Anxiety   . Arthritis   . Dysrhythmia    slight irregularity  . Headache    secondary to bulging disc.  better since surgery  . Hypertension     Assessment/Plan: 2 Days Post-Op Procedure(s) (LRB): LEFT TOTAL HIP ARTHROPLASTY (Left) Active Problems:   Status post total hip replacement, left  Estimated body mass index is 27.55 kg/m as calculated from the following:   Height as of this encounter: 5\' 7"  (1.702 m).   Weight as of this encounter: 79.8 kg. Advance diet Up with therapy D/C IV fluids Discharge home with home health planned for possibly this afternoon.  CBC and BMP ordered for today to check labs. Up with therapy. Patient has had a BM. Plan for possible d/c home pending progress with PT.  DVT Prophylaxis - Lovenox, Foot Pumps and TED hose Weight-Bearing as tolerated to left leg  J. Cameron Proud, PA-C Orthopaedic Surgery 07/03/2019, 8:28 AM

## 2019-07-03 NOTE — Progress Notes (Signed)
Discharge summary reviewed with verbal understanding. Answered all questions. Wound vac changed to prevena

## 2020-08-23 ENCOUNTER — Other Ambulatory Visit: Payer: Self-pay | Admitting: Orthopedic Surgery

## 2020-08-26 ENCOUNTER — Encounter
Admission: RE | Admit: 2020-08-26 | Discharge: 2020-08-26 | Disposition: A | Discharge status: Home / Self Care | Payer: Managed Care, Other (non HMO) | Source: Ambulatory Visit | Attending: Orthopedic Surgery | Admitting: Orthopedic Surgery

## 2020-08-26 ENCOUNTER — Other Ambulatory Visit: Payer: Self-pay

## 2020-08-26 DIAGNOSIS — Z01818 Encounter for other preprocedural examination: Secondary | ICD-10-CM | POA: Insufficient documentation

## 2020-08-26 DIAGNOSIS — Z01810 Encounter for preprocedural cardiovascular examination: Secondary | ICD-10-CM

## 2020-08-26 HISTORY — DX: Hyperlipidemia, unspecified: E78.5

## 2020-08-26 HISTORY — DX: Atherosclerotic heart disease of native coronary artery without angina pectoris: I25.10

## 2020-08-26 HISTORY — DX: Depression, unspecified: F32.A

## 2020-08-26 LAB — TYPE AND SCREEN
ABO/RH(D): A POS
Antibody Screen: NEGATIVE

## 2020-08-26 LAB — SURGICAL PCR SCREEN
MRSA, PCR: NEGATIVE
Staphylococcus aureus: NEGATIVE

## 2020-08-26 NOTE — Patient Instructions (Signed)
Your procedure is scheduled on: Tuesday, Oct. 5 Report to Day Surgery on the 2nd floor of the [facility] To find out your arrival time, please call [phone] between 1PM - 3PM on: Mon. Oct. 4  REMEMBER: Instructions that are not followed completely may result in serious medical risk, up to and including death; or upon the discretion of your surgeon and anesthesiologist your surgery may need to be rescheduled.  Do not eat food after midnight the night before surgery.  No gum chewing, lozengers or hard candies.  You may however, drink CLEAR liquids up to 2 hours before you are scheduled to arrive for your surgery. Do not drink anything within 2 hours of your scheduled arrival time.  Clear liquids include: - water  - apple juice without pulp - gatorade (not RED) - black coffee or tea (Do NOT add milk or creamers to the coffee or tea) Do NOT drink anything that is not on this list.  In addition, your doctor has ordered for you to drink the provided  Ensure Pre-Surgery Clear Carbohydrate Drink  Drinking this carbohydrate drink up to two hours before surgery helps to reduce insulin resistance and improve patient outcomes. Please complete drinking 2 hours prior to scheduled arrival time.  TAKE THESE MEDICATIONS THE MORNING OF SURGERY WITH A SIP OF WATER:  1.  Lexapro 2.  Metoprolol  One week prior to surgery: Stop Anti-inflammatories (NSAIDS) such as Advil, Aleve, Ibuprofen, Motrin, Naproxen, Naprosyn and Aspirin based products such as Excedrin, Goodys Powder, BC Powder. Stop ANY OVER THE COUNTER supplements until after surgery. (You may continue taking Tylenol, multivitamin.)  No Alcohol for 24 hours before or after surgery.  On the morning of surgery brush your teeth with toothpaste and water, you may rinse your mouth with mouthwash if you wish. Do not swallow any toothpaste or mouthwash.  Do not wear jewelry.  Do not wear lotions, powders, or perfumes.   Do not shave 48  hours prior to surgery.   Contact lenses may not be worn into surgery.  Do not bring valuables to the hospital. [facility] is not responsible for any missing/lost belongings or valuables.   Use CHG Soap as directed on instruction sheet.  Notify your doctor if there is any change in your medical condition (cold, fever, infection).  Wear comfortable clothing (specific to your surgery type) to the hospital.  Plan for stool softeners for home use; pain medications have a tendency to cause constipation. You can also help prevent constipation by eating foods high in fiber such as fruits and vegetables and drinking plenty of fluids as your diet allows.  After surgery, you can help prevent lung complications by doing breathing exercises.  Take deep breaths and cough every 1-2 hours. Your doctor may order a device called an Incentive Spirometer to help you take deep breaths.  If you are being admitted to the hospital overnight, leave your suitcase in the car. After surgery it may be brought to your room.  If you are being discharged the day of surgery, you will not be allowed to drive home. You will need a responsible adult (18 years or older) to drive you home and stay with you that night.   If you are taking public transportation, you will need to have a responsible adult (18 years or older) with you. Please confirm with your physician that it is acceptable to use public transportation.   Please call the Pre-admissions Testing Dept. at [phone] if you  have any questions about these instructions.  Masking is required regardless of vaccination status.  Systemwide, no visitors 17 or younger.

## 2020-08-27 ENCOUNTER — Other Ambulatory Visit
Admission: RE | Admit: 2020-08-27 | Discharge: 2020-08-27 | Disposition: A | Discharge status: Home / Self Care | Payer: Managed Care, Other (non HMO) | Source: Ambulatory Visit | Attending: Surgery | Admitting: Surgery

## 2020-08-27 DIAGNOSIS — Z01812 Encounter for preprocedural laboratory examination: Secondary | ICD-10-CM | POA: Insufficient documentation

## 2020-08-27 DIAGNOSIS — Z20822 Contact with and (suspected) exposure to covid-19: Secondary | ICD-10-CM | POA: Insufficient documentation

## 2020-08-28 LAB — SARS CORONAVIRUS 2 (TAT 6-24 HRS): SARS Coronavirus 2: NEGATIVE

## 2020-08-31 ENCOUNTER — Ambulatory Visit: Payer: Managed Care, Other (non HMO)

## 2020-08-31 ENCOUNTER — Ambulatory Visit: Payer: Managed Care, Other (non HMO) | Admitting: Certified Registered"

## 2020-08-31 ENCOUNTER — Ambulatory Visit
Admission: RE | Admit: 2020-08-31 | Discharge: 2020-08-31 | Disposition: A | Discharge status: Home / Self Care | Payer: Managed Care, Other (non HMO) | Attending: Orthopedic Surgery | Admitting: Orthopedic Surgery

## 2020-08-31 ENCOUNTER — Other Ambulatory Visit: Payer: Self-pay

## 2020-08-31 ENCOUNTER — Encounter
Admission: RE | Disposition: A | Discharge status: Home / Self Care | Payer: Self-pay | Source: Home / Self Care | Attending: Orthopedic Surgery

## 2020-08-31 ENCOUNTER — Encounter: Payer: Self-pay | Admitting: Orthopedic Surgery

## 2020-08-31 DIAGNOSIS — M25551 Pain in right hip: Secondary | ICD-10-CM | POA: Diagnosis not present

## 2020-08-31 DIAGNOSIS — G8918 Other acute postprocedural pain: Secondary | ICD-10-CM

## 2020-08-31 DIAGNOSIS — Z79899 Other long term (current) drug therapy: Secondary | ICD-10-CM | POA: Diagnosis not present

## 2020-08-31 DIAGNOSIS — E785 Hyperlipidemia, unspecified: Secondary | ICD-10-CM | POA: Diagnosis not present

## 2020-08-31 DIAGNOSIS — F32A Depression, unspecified: Secondary | ICD-10-CM | POA: Diagnosis not present

## 2020-08-31 DIAGNOSIS — Z818 Family history of other mental and behavioral disorders: Secondary | ICD-10-CM | POA: Insufficient documentation

## 2020-08-31 DIAGNOSIS — M1990 Unspecified osteoarthritis, unspecified site: Secondary | ICD-10-CM | POA: Insufficient documentation

## 2020-08-31 DIAGNOSIS — Z8249 Family history of ischemic heart disease and other diseases of the circulatory system: Secondary | ICD-10-CM | POA: Insufficient documentation

## 2020-08-31 DIAGNOSIS — I10 Essential (primary) hypertension: Secondary | ICD-10-CM | POA: Insufficient documentation

## 2020-08-31 DIAGNOSIS — M879 Osteonecrosis, unspecified: Secondary | ICD-10-CM | POA: Insufficient documentation

## 2020-08-31 DIAGNOSIS — R519 Headache, unspecified: Secondary | ICD-10-CM | POA: Insufficient documentation

## 2020-08-31 DIAGNOSIS — Z87891 Personal history of nicotine dependence: Secondary | ICD-10-CM | POA: Insufficient documentation

## 2020-08-31 DIAGNOSIS — I2510 Atherosclerotic heart disease of native coronary artery without angina pectoris: Secondary | ICD-10-CM | POA: Insufficient documentation

## 2020-08-31 DIAGNOSIS — Z419 Encounter for procedure for purposes other than remedying health state, unspecified: Secondary | ICD-10-CM

## 2020-08-31 DIAGNOSIS — F419 Anxiety disorder, unspecified: Secondary | ICD-10-CM | POA: Diagnosis not present

## 2020-08-31 HISTORY — PX: TOTAL HIP ARTHROPLASTY: SHX124

## 2020-08-31 LAB — CBC
HCT: 31 % — ABNORMAL LOW (ref 39.0–52.0)
Hemoglobin: 10.3 g/dL — ABNORMAL LOW (ref 13.0–17.0)
MCH: 27.8 pg (ref 26.0–34.0)
MCHC: 33.2 g/dL (ref 30.0–36.0)
MCV: 83.6 fL (ref 80.0–100.0)
Platelets: 206 10*3/uL (ref 150–400)
RBC: 3.71 MIL/uL — ABNORMAL LOW (ref 4.22–5.81)
RDW: 16.5 % — ABNORMAL HIGH (ref 11.5–15.5)
WBC: 13.4 10*3/uL — ABNORMAL HIGH (ref 4.0–10.5)
nRBC: 0 % (ref 0.0–0.2)

## 2020-08-31 LAB — CREATININE, SERUM
Creatinine, Ser: 0.84 mg/dL (ref 0.61–1.24)
GFR calc non Af Amer: >60 mL/min (ref >60)

## 2020-08-31 LAB — GLUCOSE, CAPILLARY: Glucose-Capillary: 85 mg/dL (ref 70–99)

## 2020-08-31 SURGERY — ARTHROPLASTY, HIP, TOTAL, ANTERIOR APPROACH
Anesthesia: Spinal | Site: Hip | Laterality: Right

## 2020-08-31 MED ORDER — LACTATED RINGERS IV BOLUS
500.0000 mL | Freq: Once | INTRAVENOUS | Status: AC
  Administered 2020-08-31: 500 mL via INTRAVENOUS

## 2020-08-31 MED ORDER — LACTATED RINGERS IV BOLUS: 250.0000 mL | Freq: Once | INTRAVENOUS | Status: DC

## 2020-08-31 MED ORDER — ENOXAPARIN SODIUM 40 MG/0.4ML ~~LOC~~ SOLN: 40.0000 mg | SUBCUTANEOUS | 0 refills | Status: DC

## 2020-08-31 MED ORDER — MIDAZOLAM HCL 2 MG/2ML IJ SOLN
INTRAMUSCULAR | Status: AC
  Filled 2020-08-31: qty 2

## 2020-08-31 MED ORDER — CEFAZOLIN SODIUM-DEXTROSE 2-4 GM/100ML-% IV SOLN
INTRAVENOUS | Status: AC
  Filled 2020-08-31: qty 100

## 2020-08-31 MED ORDER — CHLORHEXIDINE GLUCONATE 0.12 % MT SOLN
OROMUCOSAL | Status: AC
  Filled 2020-08-31: qty 15

## 2020-08-31 MED ORDER — OXYCODONE HCL 5 MG PO TABS: 5.0000 mg | ORAL_TABLET | ORAL | 0 refills | Status: AC | PRN

## 2020-08-31 MED ORDER — TRAMADOL HCL 50 MG PO TABS
ORAL_TABLET | ORAL | Status: AC
  Administered 2020-08-31: 50 mg via ORAL
  Filled 2020-08-31: qty 1

## 2020-08-31 MED ORDER — ORAL CARE MOUTH RINSE: 15.0000 mL | Freq: Once | OROMUCOSAL | Status: AC

## 2020-08-31 MED ORDER — OXYCODONE HCL 5 MG PO TABS
ORAL_TABLET | ORAL | Status: AC
  Administered 2020-08-31: 10 mg via ORAL
  Filled 2020-08-31: qty 1

## 2020-08-31 MED ORDER — CEFAZOLIN SODIUM-DEXTROSE 2-4 GM/100ML-% IV SOLN
2.0000 g | INTRAVENOUS | Status: AC
  Administered 2020-08-31: 2 g via INTRAVENOUS

## 2020-08-31 MED ORDER — ONDANSETRON HCL 4 MG PO TABS: 4.0000 mg | ORAL_TABLET | Freq: Four times a day (QID) | ORAL | Status: DC | PRN

## 2020-08-31 MED ORDER — LACTATED RINGERS IV SOLN: INTRAVENOUS | Status: DC

## 2020-08-31 MED ORDER — FENTANYL CITRATE (PF) 100 MCG/2ML IJ SOLN: 25.0000 ug | INTRAMUSCULAR | Status: DC | PRN

## 2020-08-31 MED ORDER — METHOCARBAMOL 500 MG PO TABS: 500.0000 mg | ORAL_TABLET | Freq: Four times a day (QID) | ORAL | Status: DC | PRN

## 2020-08-31 MED ORDER — TRANEXAMIC ACID-NACL 1000-0.7 MG/100ML-% IV SOLN
INTRAVENOUS | Status: AC
  Filled 2020-08-31: qty 100

## 2020-08-31 MED ORDER — PHENOL 1.4 % MT LIQD
1.0000 | OROMUCOSAL | Status: DC | PRN
  Filled 2020-08-31: qty 177

## 2020-08-31 MED ORDER — FAMOTIDINE 20 MG PO TABS
20.0000 mg | ORAL_TABLET | Freq: Once | ORAL | Status: AC
  Administered 2020-08-31: 20 mg via ORAL

## 2020-08-31 MED ORDER — CHLORHEXIDINE GLUCONATE 0.12 % MT SOLN
15.0000 mL | Freq: Once | OROMUCOSAL | Status: AC
  Administered 2020-08-31: 15 mL via OROMUCOSAL

## 2020-08-31 MED ORDER — OXYCODONE HCL 5 MG PO TABS
5.0000 mg | ORAL_TABLET | ORAL | Status: DC | PRN
  Administered 2020-08-31: 5 mg via ORAL

## 2020-08-31 MED ORDER — HYDROMORPHONE HCL 1 MG/ML IJ SOLN: 0.5000 mg | INTRAMUSCULAR | Status: DC | PRN

## 2020-08-31 MED ORDER — EPHEDRINE SULFATE 50 MG/ML IJ SOLN
INTRAMUSCULAR | Status: DC | PRN
  Administered 2020-08-31: 10 mg via INTRAVENOUS
  Administered 2020-08-31: 20 mg via INTRAVENOUS

## 2020-08-31 MED ORDER — BUPIVACAINE HCL (PF) 0.5 % IJ SOLN
INTRAMUSCULAR | Status: AC
  Filled 2020-08-31: qty 10

## 2020-08-31 MED ORDER — BUPIVACAINE HCL (PF) 0.5 % IJ SOLN
INTRAMUSCULAR | Status: DC | PRN
  Administered 2020-08-31: 3 mL via INTRATHECAL

## 2020-08-31 MED ORDER — GLYCOPYRROLATE 0.2 MG/ML IJ SOLN
INTRAMUSCULAR | Status: AC
  Filled 2020-08-31: qty 1

## 2020-08-31 MED ORDER — PROPOFOL 500 MG/50ML IV EMUL
INTRAVENOUS | Status: DC | PRN
  Administered 2020-08-31: 35 ug/kg/min via INTRAVENOUS

## 2020-08-31 MED ORDER — EPHEDRINE 5 MG/ML INJ
INTRAVENOUS | Status: AC
  Filled 2020-08-31: qty 10

## 2020-08-31 MED ORDER — PROPOFOL 10 MG/ML IV BOLUS
INTRAVENOUS | Status: AC
  Filled 2020-08-31: qty 20

## 2020-08-31 MED ORDER — FENTANYL CITRATE (PF) 100 MCG/2ML IJ SOLN
INTRAMUSCULAR | Status: AC
  Filled 2020-08-31: qty 2

## 2020-08-31 MED ORDER — LIDOCAINE HCL (PF) 2 % IJ SOLN
INTRAMUSCULAR | Status: AC
  Filled 2020-08-31: qty 5

## 2020-08-31 MED ORDER — ENOXAPARIN SODIUM 40 MG/0.4ML ~~LOC~~ SOLN: 40.0000 mg | SUBCUTANEOUS | Status: DC

## 2020-08-31 MED ORDER — FENTANYL CITRATE (PF) 100 MCG/2ML IJ SOLN
INTRAMUSCULAR | Status: DC | PRN
Start: 2020-08-31 — End: 2020-08-31
  Administered 2020-08-31 (×2): 50 ug via INTRAVENOUS

## 2020-08-31 MED ORDER — LIDOCAINE HCL (PF) 2 % IJ SOLN
INTRAMUSCULAR | Status: DC | PRN
  Administered 2020-08-31: 50 mg

## 2020-08-31 MED ORDER — OXYCODONE HCL 5 MG PO TABS: 10.0000 mg | ORAL_TABLET | ORAL | Status: DC | PRN

## 2020-08-31 MED ORDER — METOCLOPRAMIDE HCL 10 MG PO TABS: 5.0000 mg | ORAL_TABLET | Freq: Three times a day (TID) | ORAL | Status: DC | PRN

## 2020-08-31 MED ORDER — METOCLOPRAMIDE HCL 5 MG/ML IJ SOLN: 5.0000 mg | Freq: Three times a day (TID) | INTRAMUSCULAR | Status: DC | PRN

## 2020-08-31 MED ORDER — MENTHOL 3 MG MT LOZG
1.0000 | LOZENGE | OROMUCOSAL | Status: DC | PRN
  Filled 2020-08-31: qty 9

## 2020-08-31 MED ORDER — ONDANSETRON HCL 4 MG/2ML IJ SOLN: 4.0000 mg | Freq: Four times a day (QID) | INTRAMUSCULAR | Status: DC | PRN

## 2020-08-31 MED ORDER — SODIUM CHLORIDE 0.9 % IV SOLN: INTRAVENOUS | Status: DC

## 2020-08-31 MED ORDER — ACETAMINOPHEN 325 MG PO TABS: 325.0000 mg | ORAL_TABLET | Freq: Four times a day (QID) | ORAL | Status: DC | PRN

## 2020-08-31 MED ORDER — MIDAZOLAM HCL 5 MG/5ML IJ SOLN
INTRAMUSCULAR | Status: DC | PRN
  Administered 2020-08-31 (×2): 2 mg via INTRAVENOUS

## 2020-08-31 MED ORDER — PHENYLEPHRINE HCL (PRESSORS) 10 MG/ML IV SOLN
INTRAVENOUS | Status: DC | PRN
  Administered 2020-08-31: 100 ug via INTRAVENOUS
  Administered 2020-08-31: 200 ug via INTRAVENOUS
  Administered 2020-08-31: 100 ug via INTRAVENOUS

## 2020-08-31 MED ORDER — FAMOTIDINE 20 MG PO TABS
ORAL_TABLET | ORAL | Status: AC
  Filled 2020-08-31: qty 1

## 2020-08-31 MED ORDER — DOCUSATE SODIUM 100 MG PO CAPS
100.0000 mg | ORAL_CAPSULE | Freq: Two times a day (BID) | ORAL | Status: DC
  Filled 2020-08-31: qty 1

## 2020-08-31 MED ORDER — KETAMINE HCL 50 MG/ML IJ SOLN
INTRAMUSCULAR | Status: DC | PRN
  Administered 2020-08-31: 50 mg via INTRAVENOUS

## 2020-08-31 MED ORDER — TRANEXAMIC ACID-NACL 1000-0.7 MG/100ML-% IV SOLN
1000.0000 mg | Freq: Once | INTRAVENOUS | Status: AC
  Administered 2020-08-31: 1000 mg via INTRAVENOUS

## 2020-08-31 MED ORDER — OXYCODONE HCL 5 MG PO TABS
ORAL_TABLET | ORAL | Status: AC
  Filled 2020-08-31: qty 2

## 2020-08-31 MED ORDER — GLYCOPYRROLATE 0.2 MG/ML IJ SOLN
INTRAMUSCULAR | Status: DC | PRN
  Administered 2020-08-31: .2 mg via INTRAVENOUS

## 2020-08-31 MED ORDER — ONDANSETRON HCL 4 MG/2ML IJ SOLN: 4.0000 mg | Freq: Once | INTRAMUSCULAR | Status: DC | PRN

## 2020-08-31 MED ORDER — METHOCARBAMOL 1000 MG/10ML IJ SOLN
500.0000 mg | Freq: Four times a day (QID) | INTRAVENOUS | Status: DC | PRN
  Filled 2020-08-31: qty 5

## 2020-08-31 MED ORDER — TRAMADOL HCL 50 MG PO TABS: 50.0000 mg | ORAL_TABLET | Freq: Four times a day (QID) | ORAL | Status: DC

## 2020-08-31 SURGICAL SUPPLY — 63 items
APL PRP STRL LF DISP 70% ISPRP (MISCELLANEOUS) ×1
BLADE SAGITTAL AGGR TOOTH XLG (BLADE) ×2 IMPLANT
BNDG COHESIVE 6X5 TAN STRL LF (GAUZE/BANDAGES/DRESSINGS) ×6 IMPLANT
CANISTER SUCT 1200ML W/VALVE (MISCELLANEOUS) ×2 IMPLANT
CANISTER WOUND CARE 500ML ATS (WOUND CARE) ×2 IMPLANT
CHLORAPREP W/TINT 26 (MISCELLANEOUS) ×2 IMPLANT
COVER BACK TABLE REUSABLE LG (DRAPES) ×2 IMPLANT
COVER WAND RF STERILE (DRAPES) ×2 IMPLANT
DRAPE 3/4 80X56 (DRAPES) ×6 IMPLANT
DRAPE C-ARM XRAY 36X54 (DRAPES) ×2 IMPLANT
DRAPE INCISE IOBAN 66X60 STRL (DRAPES) IMPLANT
DRAPE POUCH INSTRU U-SHP 10X18 (DRAPES) ×2 IMPLANT
DRESSING SURGICEL FIBRLLR 1X2 (HEMOSTASIS) ×2 IMPLANT
DRSG MEPILEX SACRM 8.7X9.8 (GAUZE/BANDAGES/DRESSINGS) ×2 IMPLANT
DRSG OPSITE POSTOP 4X8 (GAUZE/BANDAGES/DRESSINGS) ×4 IMPLANT
DRSG SURGICEL FIBRILLAR 1X2 (HEMOSTASIS) ×4
ELECT BLADE 6.5 EXT (BLADE) ×2 IMPLANT
ELECT REM PT RETURN 9FT ADLT (ELECTROSURGICAL) ×2
ELECTRODE REM PT RTRN 9FT ADLT (ELECTROSURGICAL) ×1 IMPLANT
GLOVE BIOGEL PI IND STRL 9 (GLOVE) ×1 IMPLANT
GLOVE BIOGEL PI INDICATOR 9 (GLOVE) ×1
GLOVE SURG SYN 9.0  PF PI (GLOVE) ×2
GLOVE SURG SYN 9.0 PF PI (GLOVE) ×2 IMPLANT
GOWN SRG 2XL LVL 4 RGLN SLV (GOWNS) ×1 IMPLANT
GOWN STRL NON-REIN 2XL LVL4 (GOWNS) ×2
GOWN STRL REUS W/ TWL LRG LVL3 (GOWN DISPOSABLE) ×1 IMPLANT
GOWN STRL REUS W/TWL LRG LVL3 (GOWN DISPOSABLE) ×2
HEAD FEMORAL 28MM SZ S (Head) ×2 IMPLANT
HEMOVAC 400CC 10FR (MISCELLANEOUS) IMPLANT
HOLDER FOLEY CATH W/STRAP (MISCELLANEOUS) ×2 IMPLANT
HOOD PEEL AWAY FLYTE STAYCOOL (MISCELLANEOUS) ×2 IMPLANT
IRRIGATION SURGIPHOR STRL (IV SOLUTION) IMPLANT
KIT PREVENA INCISION MGT 13 (CANNISTER) ×2 IMPLANT
LINER DM 28MM (Liner) ×2 IMPLANT
LINER DM SZH 28X56 (Liner) ×1 IMPLANT
MAT ABSORB  FLUID 56X50 GRAY (MISCELLANEOUS) ×1
MAT ABSORB FLUID 56X50 GRAY (MISCELLANEOUS) ×1 IMPLANT
NDL SAFETY ECLIPSE 18X1.5 (NEEDLE) ×1 IMPLANT
NEEDLE HYPO 18GX1.5 SHARP (NEEDLE) ×2
NEEDLE SPNL 20GX3.5 QUINCKE YW (NEEDLE) ×4 IMPLANT
NS IRRIG 1000ML POUR BTL (IV SOLUTION) ×2 IMPLANT
PACK HIP COMPR (MISCELLANEOUS) ×2 IMPLANT
SCALPEL PROTECTED #10 DISP (BLADE) ×4 IMPLANT
SHELL ACETABULAR DM  56MM (Shell) ×2 IMPLANT
SOL PREP PVP 2OZ (MISCELLANEOUS) ×2
SOLUTION PREP PVP 2OZ (MISCELLANEOUS) ×1 IMPLANT
SPONGE DRAIN TRACH 4X4 STRL 2S (GAUZE/BANDAGES/DRESSINGS) ×2 IMPLANT
STAPLER SKIN PROX 35W (STAPLE) ×2 IMPLANT
STEM FEMORAL SZ3  STD COLLARED (Stem) ×2 IMPLANT
STRAP SAFETY 5IN WIDE (MISCELLANEOUS) ×2 IMPLANT
SUT DVC 2 QUILL PDO  T11 36X36 (SUTURE) ×1
SUT DVC 2 QUILL PDO T11 36X36 (SUTURE) ×1 IMPLANT
SUT SILK 0 (SUTURE) ×2
SUT SILK 0 30XBRD TIE 6 (SUTURE) ×1 IMPLANT
SUT V-LOC 90 ABS DVC 3-0 CL (SUTURE) ×2 IMPLANT
SUT VIC AB 1 CT1 36 (SUTURE) ×2 IMPLANT
SYR 20ML LL LF (SYRINGE) ×2 IMPLANT
SYR 30ML LL (SYRINGE) ×2 IMPLANT
SYR 50ML LL SCALE MARK (SYRINGE) ×4 IMPLANT
SYR BULB IRRIG 60ML STRL (SYRINGE) ×2 IMPLANT
TAPE MICROFOAM 4IN (TAPE) ×2 IMPLANT
TOWEL OR 17X26 4PK STRL BLUE (TOWEL DISPOSABLE) ×2 IMPLANT
TRAY FOLEY MTR SLVR 16FR STAT (SET/KITS/TRAYS/PACK) ×2 IMPLANT

## 2020-08-31 NOTE — Progress Notes (Signed)
Lunch tray ordered for patient.

## 2020-08-31 NOTE — Anesthesia Preprocedure Evaluation (Signed)
Anesthesia Evaluation  Patient identified by MRN, date of birth, ID band Patient awake    Reviewed: Allergy & Precautions, NPO status , Patient's Chart, lab work & pertinent test results, reviewed documented beta blocker date and time   Airway Mallampati: III  TM Distance: >3 FB     Dental  (+) Chipped   Pulmonary former smoker,           Cardiovascular hypertension, Pt. on medications and Pt. on home beta blockers + CAD  + dysrhythmias      Neuro/Psych  Headaches, PSYCHIATRIC DISORDERS Anxiety Depression    GI/Hepatic negative GI ROS, Neg liver ROS,   Endo/Other  negative endocrine ROS  Renal/GU negative Renal ROS  negative genitourinary   Musculoskeletal  (+) Arthritis , Osteoarthritis,    Abdominal   Peds negative pediatric ROS (+)  Hematology negative hematology ROS (+)   Anesthesia Other Findings Past Medical History: No date: Anxiety No date: Arthritis No date: Coronary artery disease No date: Depression No date: Dysrhythmia     Comment:  slight irregularity No date: Headache     Comment:  secondary to bulging disc. better since surgery No date: Hyperlipidemia No date: Hypertension   Reproductive/Obstetrics                             Anesthesia Physical  Anesthesia Plan  ASA: III  Anesthesia Plan: Spinal   Post-op Pain Management:    Induction: Intravenous  PONV Risk Score and Plan:   Airway Management Planned: Nasal Cannula  Additional Equipment:   Intra-op Plan:   Post-operative Plan:   Informed Consent: I have reviewed the patients History and Physical, chart, labs and discussed the procedure including the risks, benefits and alternatives for the proposed anesthesia with the patient or authorized representative who has indicated his/her understanding and acceptance.       Plan Discussed with: CRNA  Anesthesia Plan Comments: (Patient had vagal episode  preop.  EKG and sugar WNL.   Patient seen by cardiology preop and cleared for surgery with no further workup reccs.)        Anesthesia Quick Evaluation

## 2020-08-31 NOTE — Consult Note (Signed)
Cardiology Consultation Note    Patient ID: Dennis Davidson, MRN: [id number], DOB/AGE: [birth year] 44 y.o. Admit date: 08/31/2020   Date of Consult: 08/31/2020 Primary Physician: Marguarite Arbour, MD Primary Cardiologist:    Chief Complaint: syncope Reason for Consultation: syncope Requesting MD: Dr. Rosita Kea  HPI: Dennis Davidson is a 44 y.o. male with history of noncritical coronary artery disease by cardiac catheterization in 2017 with preserved LV function, history of anterior cervical decompression and discectomy, history left total hip arthroscopy who presented to the hospital today for elective who presented for elective orthopedic surgery today.  While being prepped he had a syncopal episode where he was noted to be unresponsive for 10 seconds.  He diaphoretic during this episode.  He was not on the monitor.  He is on atorvastatin 80 mg daily, lisinopril-hydrochlorothiazide 10-12.5 mg daily, metoprolol tartrate 25 mg twice daily as an outpatient.  Patient states that he has a history of syncopal episodes when in a similar situation.  He denies syncope or chest pain when active.  He denies any symptoms at present.  He is a completely alert and oriented.  Electrocardiogram done today revealed sinus rhythm with no ischemia or interval abnormalities.  Past Medical History:  Diagnosis Date  . Anxiety   . Arthritis   . Coronary artery disease   . Depression   . Dysrhythmia    slight irregularity  . Headache    secondary to bulging disc. better since surgery  . Hyperlipidemia   . Hypertension       Surgical History:  Past Surgical History:  Procedure Laterality Date  . ANTERIOR CERVICAL DECOMP/DISCECTOMY FUSION N/A 08/05/2018   Procedure: ANTERIOR CERVICAL DECOMPRESSION/DISCECTOMY FUSION 2 LEVELS-C4-5, C5-6;  Surgeon: [name], MD;  Location: [facility] ORS;  Service: Neurosurgery;  Laterality: N/A;  . CARDIAC CATHETERIZATION Left 09/07/2016   Procedure: Left Heart Cath and Coronary  Angiography;  Surgeon: Alwyn Pea, MD;  Location: [facility] INVASIVE CV LAB;  Service: Cardiovascular;  Laterality: Left;  . JOINT REPLACEMENT    . MOUTH SURGERY     teeth pulled. impacted molars  . TOTAL HIP ARTHROPLASTY Left 07/01/2019   Procedure: LEFT TOTAL HIP ARTHROPLASTY;  Surgeon: Kennedy Bucker, MD;  Location: [facility] ORS;  Service: Orthopedics;  Laterality: Left;     Home Meds: Prior to Admission medications   Medication Sig Start Date End Date Taking? Authorizing Provider  ALPRAZolam Prudy Feeler) 0.5 MG tablet Take 0.5 mg by mouth 3 (three) times daily as needed for anxiety.    Yes [provider]  atorvastatin (LIPITOR) 80 MG tablet Take 80 mg by mouth every evening. 02/07/17  Yes [provider]  cetirizine (ZYRTEC) 10 MG tablet Take 10 mg by mouth daily as needed for allergies.   Yes [provider]  escitalopram (LEXAPRO) 20 MG tablet Take 20 mg by mouth daily. 11/13/16  Yes [provider]  lisinopril-hydrochlorothiazide (PRINZIDE,ZESTORETIC) 10-12.5 MG tablet TAKE ONE TABLET EVERY DAY Patient taking differently: Take 1 tablet by mouth daily.  08/20/17  Yes Fisher, Roselyn Bering, PA-C  metoprolol tartrate (LOPRESSOR) 25 MG tablet Take 25 mg by mouth 2 (two) times daily.   Yes [provider]  Multiple Vitamin (MULTIVITAMIN WITH MINERALS) TABS tablet Take 1 tablet by mouth daily.   Yes [provider]    Inpatient Medications:  . chlorhexidine  15 mL Mouth/Throat Once   Or  . mouth rinse  15 mL Mouth Rinse Once  . famotidine  20 mg  Oral Once   .  ceFAZolin (ANCEF) IV    . lactated ringers      Allergies: No Known Allergies  Social History   Socioeconomic History  . Marital status: Married    Spouse name: Melina Schools  . Number of children: Not on file  . Years of education: Not on file  . Highest education level: Not on file  Occupational History  . Occupation: Emergency planning/management officer  Tobacco Use  . Smoking status: Former Smoker     Packs/day: 0.25    Types: Cigarettes    Quit date: 12/08/2007    Years since quitting: 12.7  . Smokeless tobacco: Never Used  Vaping Use  . Vaping Use: Never used  Substance and Sexual Activity  . Alcohol use: Yes    Alcohol/week: 0.0 standard drinks    Comment: 1-2 month  . Drug use: No  . Sexual activity: Not on file  Other Topics Concern  . Not on file  Social History Narrative   ** Merged History Encounter **       Social Determinants of Health   Financial Resource Strain:   . Difficulty of Paying Living Expenses: Not on file  Food Insecurity:   . Worried About Programme researcher, broadcasting/film/video in the Last Year: Not on file  . Ran Out of Food in the Last Year: Not on file  Transportation Needs:   . Lack of Transportation (Medical): Not on file  . Lack of Transportation (Non-Medical): Not on file  Physical Activity:   . Days of Exercise per Week: Not on file  . Minutes of Exercise per Session: Not on file  Stress:   . Feeling of Stress : Not on file  Social Connections:   . Frequency of Communication with Friends and Family: Not on file  . Frequency of Social Gatherings with Friends and Family: Not on file  . Attends Religious Services: Not on file  . Active Member of Clubs or Organizations: Not on file  . Attends Banker Meetings: Not on file  . Marital Status: Not on file  Intimate Partner Violence:   . Fear of Current or Ex-Partner: Not on file  . Emotionally Abused: Not on file  . Physically Abused: Not on file  . Sexually Abused: Not on file     Family History  Problem Relation Age of Onset  . Diabetes Mother   . Diabetes Father      Review of Systems: A 12-system review of systems was performed and is negative except as noted in the HPI.  Labs: No results for input(s): CKTOTAL, CKMB, TROPONINI in the last 72 hours. Lab Results  Component Value Date   WBC 10.1 07/03/2019   HGB 10.2 (L) 07/03/2019   HCT 31.9 (L) 07/03/2019   MCV 99.7 07/03/2019    PLT 164 07/03/2019   No results for input(s): NA, K, CL, CO2, BUN, CREATININE, CALCIUM, PROT, BILITOT, ALKPHOS, ALT, AST, GLUCOSE in the last 168 hours.  Invalid input(s): LABALBU Lab Results  Component Value Date   CHOL 266 (H) 12/10/2015   HDL 61 12/10/2015   LDLCALC 174 (H) 12/10/2015   TRIG 157 (H) 12/10/2015   No results found for: DDIMER  Radiology/Studies:  No results found.  Wt Readings from Last 3 Encounters:  08/26/20 79.4 kg  07/01/19 79.8 kg  06/26/19 79.8 kg    EKG: Sinus rhythm with no ischemia  Physical Exam:  Blood pressure (!) 135/106, pulse (!) 59, temperature 97.6 F (36.4  C), temperature source Oral, resp. rate 16, SpO2 99 %. There is no height or weight on file to calculate BMI. General: Well developed, well nourished, in no acute distress. Head: Normocephalic, atraumatic, sclera non-icteric, no xanthomas, nares are without discharge.  Neck: Negative for carotid bruits. JVD not elevated. Lungs: Clear bilaterally to auscultation without wheezes, rales, or rhonchi. Breathing is unlabored. Heart: RRR with S1 S2. No murmurs, rubs, or gallops appreciated. Abdomen: Soft, non-tender, non-distended with normoactive bowel sounds. No hepatomegaly. No rebound/guarding. No obvious abdominal masses. Msk:  Strength and tone appear normal for age. Extremities: No clubbing or cyanosis. No edema.  Distal pedal pulses are 2+ and equal bilaterally. Neuro: Alert and oriented X 3. No facial asymmetry. No focal deficit. Moves all extremities spontaneously. Psych:  Responds to questions appropriately with a normal affect.     Assessment and Plan  45 year old male with history of insignificant coronary disease by cardiac cath in 2017, history of vasovagal syncope when syncopal episode when blood pressure cuff was placed on his arm prior to hip surgery.  He was minimally responsive for approximately 10 seconds with no postictal state when awakening.  His EKG showed no ischemia or  arrhythmia.  On the telemetry his blood pressure and heart rate were normal.  He states he has a history of this in the past.  He is still asymptomatic at present.  Given scenario, I think risk for surgery is low.  He appears optimized from a cardiac standpoint.  Would proceed with no further cardiac work-up.  Signed, Dalia Heading MD 08/31/2020, 8:17 AM Pager: [phone]

## 2020-08-31 NOTE — Transfer of Care (Signed)
Immediate Anesthesia Transfer of Care Note  Patient: Dennis Davidson  Procedure(s) Performed: TOTAL HIP ARTHROPLASTY ANTERIOR APPROACH (Right Hip)  Patient Location: PACU  Anesthesia Type:Spinal  Level of Consciousness: awake, alert  and oriented  Airway & Oxygen Therapy: Patient Spontanous Breathing  Post-op Assessment: Report given to RN and Post -op Vital signs reviewed and stable  Post vital signs: Reviewed  Last Vitals:  Vitals Value Taken Time  BP    Temp    Pulse    Resp    SpO2      Last Pain:  Vitals:   08/31/20 0843  TempSrc:   PainSc: 0-No pain         Complications: No complications documented.

## 2020-08-31 NOTE — H&P (Signed)
Chief Complaint  Patient presents with  . Follow-up  Rt hip AVN, Rt knee pain    History of the Present Illness: Dennis Davidson is a 44 y.o. male here today.   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 Follow-up x-ray of the right hip obtained today.  The patient states he is unable to put shoes and socks on due to right hip pain. He locates his pain to the right groin radiating to the medial aspect.  The patient states he has lost 20 pounds, and his activity levels have increased.  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 His current schedule allows him to work only 3 days in a row.   I have reviewed past medical, surgical, social and family history, and allergies as documented in the EMR.  Past Medical History: Past Medical History:  Diagnosis Date  . Allergy  Seasonal  . Arrhythmia  . CAD in native artery 12/01/2016  . HTN, goal below 140/80 12/01/2016  . Mixed hyperlipidemia 12/01/2016  . Situational mixed anxiety and depressive disorder 12/01/2016   Past Surgical History: Past Surgical History:  Procedure Laterality Date  . C4-5 C5-6 ACDF 08/05/2018  Dr [name] at [facility]   Past Family History: Family History  Family history unknown: Yes  Problem Relation Age of Onset  . Family history unknown: Yes  . Anxiety Mother  . High blood pressure (Hypertension) Mother  . Anxiety Sister  . High blood pressure (Hypertension) Father   Medications: Current Outpatient Medications Ordered in Epic  Medication Sig Dispense Refill  . ALPRAZolam (XANAX) 0.5 MG tablet Take 1 tablet (0.5 mg total) by mouth 2 (two) times daily as needed for Sleep or Anxiety 60 tablet 0  . atorvastatin (LIPITOR) 80 MG tablet TAKE ONE TABLET EVERY DAY 30 tablet 5  . cetirizine (ZYRTEC) 10 MG tablet Take 10 mg by mouth as needed  . escitalopram oxalate (LEXAPRO) 20 MG tablet Take 1 tablet (20 mg total) by mouth once daily 30 tablet 2   . lisinopriL-hydrochlorothiazide (ZESTORETIC) 10-12.5 mg tablet Take 1 tablet by mouth once daily 30 tablet 11  . meloxicam (MOBIC) 15 MG tablet TAKE 1 TABLET BY MOUTH ONCE DAILY 30 tablet 3  . metoprolol tartrate (LOPRESSOR) 25 MG tablet TAKE ONE TABLET TWICE DAILY 60 tablet 5  . multivitamin with minerals tablet Take by mouth  . traMADoL (ULTRAM) 50 mg tablet Take 1 tablet (50 mg total) by mouth every 6 (six) hours as needed for Pain 60 tablet 2   No current Epic-ordered facility-administered medications on file.   Allergies: No Known Allergies   Body mass index is 28.07 kg/m.  Review of Systems: A comprehensive 14 point ROS was performed, reviewed, and the pertinent orthopaedic findings are documented in the HPI.  Vitals:  08/18/20 0841  BP: 148/84    General Physical Examination:   General/Constitutional: No apparent distress: well-nourished and well developed. Eyes: Pupils equal, round with synchronous movement. Lungs: Clear to auscultation HEENT: Normal Vascular: No edema, swelling or tenderness, except as noted in detailed exam. Cardiac: Heart rate and rhythm is regular. Integumentary: No impressive skin lesions present, except as noted in detailed exam. Neuro/Psych: Normal mood and affect, oriented to person, place and time.  Musculoskeletal Examination:  On exam, right hip has 10 degrees internal rotation, 20 degrees external with pain. Slightly antalgic gait.  Radiographs:  AP pelvis and lateral x-rays of  the right hip were ordered and personally reviewed today. These show collapse of the superior aspect of the femoral head, not present on prior x-ray. On the lateral view, crescent sign is visible, and on the AP view the collapse is visible. Prior left total hip is in good position.  X-ray Impression Right hip mild arthritis.  AP, lateral, standing, and sunrise x-rays of the right knee were ordered and personally reviewed today. These show mild effusion on  the lateral view. No significant osteophytes. Patellofemoral joint has relatively normal appearance, and there is some subchondral sclerosis of the medial tibia with slight joint space narrowing. No evidence of avascular necrosis.  X-ray Impression Right knee mild arthritis.  Assessment: ICD-10-CM  1. Acute pain of right hip M25.551  2. Acute pain of right knee M25.561  3. Other osteonecrosis, unspecified femur (CMS-HCC) M87.859   Plan:  The patient has clinical findings of right hip avascular necrosis and right knee mild arthritis.  We discussed the patient's x-ray findings. I explained his right hip has changed from 5 months ago, and his head is starting to collapse. He will likely need a right hip arthroplasty within 6 months. I advised him to avoid running, jumping, and other high-impact activities for 5 months.  The patient will call when he is ready to schedule right hip arthroplasty.  Attestation: I, Dawn Royse, am documenting for [employer], MD utilizing Nuance DAX.     Electronically signed by Marlena Clipper, MD at 08/18/2020 8:48 PM EDT  Reviewed H+P,  No changes noted.

## 2020-08-31 NOTE — Progress Notes (Signed)
°   08/31/20 0700  Clinical Encounter Type  Visited With Family  Visit Type Initial  Referral From Chaplain  Consult/Referral To Chaplain  While rounding [facility] waiting area, chaplain briefly spoke to Pt's wife to see how she was doing. She said all is well and she doesn't have any questions or concerns.

## 2020-08-31 NOTE — Discharge Instructions (Addendum)
Total Hip Replacement, Anterior, Care After This sheet gives you information about how to care for yourself after your procedure. Your health care provider may also give you more specific instructions. If you have problems or questions, contact your health care provider. What can I expect after the procedure? After the procedure, it is common to have:  Pain.  Stiffness.  Discomfort. Follow these instructions at home: Medicines  Take over-the-counter and prescription medicines only as told by your health care provider.  If you were prescribed a blood thinner (anticoagulant) to help prevent blood clots, take it as told by your health care provider. Incision care   Follow instructions from your health care provider about how to take care of your incision. Make sure you: ? Wash your hands with soap and water before you change your bandage (dressing). If soap and water are not available, use hand sanitizer. ? Change your dressing as told by your health care provider. ? Leave stitches (sutures), skin glue, or adhesive strips in place. These skin closures may need to stay in place for 2 weeks or longer. If adhesive strip edges start to loosen and curl up, you may trim the loose edges. Do not remove adhesive strips completely unless your health care provider tells you to do that.  Check your incision area every day for signs of infection. Check for: ? Redness, swelling, or pain. ? Fluid or blood. ? Warmth. ? Pus or a bad smell. Bathing  Do not take baths, swim, or use a hot tub until your health care provider approves. Ask your health care provider if you may take showers. You may only be allowed to take sponge baths.  Keep the dressing dry until your health care provider says it can be removed. Managing pain, stiffness, and swelling   If directed, put ice on the affected area. ? Put ice in a plastic bag. ? Place a towel between your skin and the bag. ? Leave the ice on for 20  minutes, 2-3 times a day.  Move your toes often to avoid stiffness and to lessen swelling.  Raise (elevate) your leg above the level of your heart while you are sitting or lying down. Activity  Rest as told by your health care provider.  Avoid sitting for a long time without moving. Get up to take short walks every 1-2 hours. This is important to improve blood flow and breathing. Ask for help if you feel weak or unsteady.  Do exercises as told by your health care provider or physical therapist.  Follow instructions from your health care provider about using a Bricco, crutches, or a cane. ? You may use your legs to support (bear) your body weight as told by your health care provider. Follow instructions about how much weight you may safely support on your affected leg (weight-bearing restrictions). ? A physical therapist may show you how to get out of a bed and chair and how to go up and down stairs. You will first do this with a Axelrod, crutches, or a cane and then without any of these devices. ? Once you are able to walk without a limp, you may stop using a Morris, crutches, or cane.  Return to your normal activities as told by your health care provider. Ask your health care provider what activities are safe for you. Safety  To help prevent falls, keep floors clear of objects you may trip over, and place items that you may need within easy reach.  Wear  an apron or tool belt with pockets for carrying objects. This leaves your hands free to help with your balance. Driving  Do not drive or use heavy machinery while taking prescription pain medicine.  Ask your health care provider when it is safe to drive. General instructions  Wear compression stockings as told by your health care provider. These stockings help to prevent blood clots and reduce swelling in your legs.  Continue with breathing exercises as directed by your health care provider. This helps prevent lung infection.  If  you are taking prescription pain medicine, take actions to prevent or treat constipation. Your health care provider may recommend that you: ? Drink enough fluid to keep your urine pale yellow. ? Eat foods that are high in fiber, such as fresh fruits and vegetables, whole grains, and beans. ? Limit foods that are high in fat and processed sugars, such as fried or sweet foods. ? Take an over-the-counter or prescription medicine for constipation.  Do not use any products that contain nicotine or tobacco, such as cigarettes and e-cigarettes. These can delay bone healing. If you need help quitting, ask your health care provider.  Tell your health care provider if you plan to have dental work. Also: ? Tell your dentist about your joint replacement. ? Ask your health care provider if there are any special instructions you need to follow before having dental care and routine cleanings.  Keep all follow-up visits as told by your health care provider. This is important. Contact a health care provider if:  You have a fever or chills.  You have a cough or feel short of breath.  Your medicine is not controlling your pain.  You have redness, swelling, or pain around your incision.  You have fluid or blood coming from your incision.  Your incision feels warm to the touch.  You have pus or a bad smell coming from your incision. Get help right away if you have:  Severe pain.  Trouble breathing.  Chest pain.  Redness, swelling, pain, and warmth in your calf or leg. Summary  Follow instructions from your health care provider about how to take care of your incision.  Do not take baths, swim, or use a hot tub until your health care provider approves.  Use crutches, a walker, or a cane as told by your health care provider.  If you were prescribed a blood thinner (anticoagulant) to help prevent blood clots, take it as told by your health care provider. This information is not intended to  replace advice given to you by your health care provider. Make sure you discuss any questions you have with your health care provider. Document Revised: 03/24/2019 Document Reviewed: 02/27/2018 Elsevier Patient Education  2020 [employer].    Call office if you need refill of pain medication. Okay to shower. Leave Prevena dressing in place for 1 week when it stops working home health can change it. Use a walker for 2 weeks.  Weightbearing as tolerated on right leg.   AMBULATORY SURGERY  DISCHARGE INSTRUCTIONS   1) The drugs that you were given will stay in your system until tomorrow so for the next 24 hours you should not:  A) Drive an automobile B) Make any legal decisions C) Drink any alcoholic beverage   2) You may resume regular meals tomorrow.  Today it is better to start with liquids and gradually work up to solid foods.  You may eat anything you prefer, but it is better to  start with liquids, then soup and crackers, and gradually work up to solid foods.   3) Please notify your doctor immediately if you have any unusual bleeding, trouble breathing, redness and pain at the surgery site, drainage, fever, or pain not relieved by medication.    4) Additional Instructions:        Please contact your physician with any problems or Same Day Surgery at [phone], Monday through Friday 6 am to 4 pm, or [place] [facility] number at [phone].

## 2020-08-31 NOTE — Progress Notes (Signed)
Patient was sitting upright in chair for preop assessment.  Patient speaking about feeling "very nervous". Blood pressure was taken and  patient noted color change to pale, very diaphoretic, and began to notice tremors to upper body. Patient with complete syncopal episode/loss of consciousness.  Patient was transferred to stretcher by sds staff.  Pupils noted +4 bilateral; skin continues to be diaphoretic and pale; patient return to consciousness after 10-15 seconds.  Patient was confused "what just happened".  No loss of urine control noted.  Patient blood pressure now 102 68.

## 2020-08-31 NOTE — Anesthesia Procedure Notes (Signed)
Spinal  Patient location during procedure: OR Staffing Performed: resident/CRNA  Anesthesiologist: Alvin Critchley, MD Resident/CRNA: Rolla Plate, CRNA Preanesthetic Checklist Completed: patient identified, IV checked, site marked, risks and benefits discussed, surgical consent, monitors and equipment checked, pre-op evaluation and timeout performed Spinal Block Patient position: sitting Prep: ChloraPrep and site prepped and draped Patient monitoring: heart rate, continuous pulse ox, blood pressure and cardiac monitor Approach: midline Location: L3-4 Injection technique: single-shot Needle Needle type: Introducer and Pencan  Needle gauge: 24 G Needle length: 9 cm Additional Notes Negative paresthesia. Negative blood return. Positive free-flowing CSF. Expiration date of kit checked and confirmed. Patient tolerated procedure well, without complications.

## 2020-08-31 NOTE — Op Note (Signed)
08/31/2020  11:06 AM  PATIENT:  Dennis Davidson  44 y.o. 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  PROCEDURE:  Procedure(s): TOTAL HIP ARTHROPLASTY ANTERIOR APPROACH (Right)  SURGEON: Leitha Schuller, MD  ASSISTANTS: None  ANESTHESIA:   spinal  EBL:  Total I/O In: 1300 [I.V.:1200; IV Piggyback:100] Out: 350 [Urine:200; Blood:150]  BLOOD ADMINISTERED:none  DRAINS: Incisional wound VAC   LOCAL MEDICATIONS USED:  MARCAINE    and OTHER Exparel  SPECIMEN:  Source of Specimen:  Right femoral head  DISPOSITION OF SPECIMEN:  PATHOLOGY  COUNTS:  YES  TOURNIQUET:  * No tourniquets in log *   IMPLANTS: medacta AMIS 3 standard, S ceramic head 28 mm, 56 mm Mpact DM cup and liner  DICTATION: .Dragon Dictation   The patient was brought to the operating room and after spinal anesthesia was obtained patient was placed on the operative table with the ipsilateral foot into the Medacta attachment, contralateral leg on a well-padded table. C-arm was brought in and preop template x-ray taken. After prepping and draping in usual sterile fashion appropriate patient identification and timeout procedures were completed. Anterior approach to the hip was obtained and centered over the greater trochanter and TFL muscle. The subcutaneous tissue was incised hemostasis being achieved by electrocautery. TFL fascia was incised and the muscle retracted laterally deep retractor placed. The lateral femoral circumflex vessels were identified and ligated. The anterior capsule was exposed and a capsulotomy performed. The neck was identified and a femoral neck cut carried out with a saw. The head was removed without difficulty and showed cartilage peeling off the collapsed femoral head and acetabulum. Reaming was carried out to 56 mm and a 56 mm cup trial gave  appropriate tightness to the acetabular component a 56 DM cup was impacted into position. The leg was then externally rotated and ischiofemoral and pubofemoral releases carried out. The femur was sequentially broached to a size 3, size 3 standard with S head trials were placed and the final components chosen. The 3 standard stem was inserted along with a ceramic S 28 mm head and 56 mm liner. The hip was reduced and was stable the wound was thoroughly irrigated with fibrillar placed along the posterior capsule and medial neck.  Exparel was injected throughout the case.  The deep fascia ws closed using a heavy Quill after infiltration of 30 cc of quarter percent Sensorcaine with epinephrine.3-0 V-loc to close the skin with skin staples.  Incisional wound VAC applied and patient was sent to recovery in stable condition.   PLAN OF CARE: Discharge to home after PACU

## 2020-08-31 NOTE — Evaluation (Signed)
Physical Therapy Evaluation Patient Details Name: Dennis Davidson MRN: [id number] DOB: 10-17-1976 Today's Date: 08/31/2020   History of Present Illness  Pt is a 44 y.o. male s/p R THA anterior approach 10/5 secondary avascular necrosis R femoral head.  Pt with syncopal episode prior to surgery.  PMH includes L THA 07/01/19, anxiety, CAD, depression, HA, htn, ACDF.  Clinical Impression  Prior to hospital admission, pt was independent with ambulation (used RW or cane at night at home most recently d/t R hip pain); lives with his wife in 2 story home (plans to stay on main level initially upon discharge) with 5 STE with railings B.  5/10 R hip pain beginning of session but improved to 2-3/10 during and end of session at rest.  Currently pt is SBA semi-supine to sitting edge of bed; CGA with transfers; CGA to SBA with ambulation 150 feet with RW; and CGA navigating 5 steps with UE support on railing.  Reviewed car transfer technique and safety with mobility: pt and pt's wife verbalizing appropriate understanding.  Pt would benefit from skilled PT to address noted impairments and functional limitations (see below for any additional details).  Upon hospital discharge, pt would benefit from HHPT.    Follow Up Recommendations Home health PT    Equipment Recommendations   (pt has RW and BSC at home already)    Recommendations for Other Services       Precautions / Restrictions Precautions Precautions: Fall;Anterior Hip Precaution Booklet Issued: Yes (comment) Precaution Comments: wound vac Restrictions Weight Bearing Restrictions: Yes RLE Weight Bearing: Weight bearing as tolerated      Mobility  Bed Mobility Overal bed mobility: Needs Assistance Bed Mobility: Supine to Sit     Supine to sit: Supervision;HOB elevated     General bed mobility comments: increased effort to perform on own; initial cueing for technique  Transfers Overall transfer level: Needs assistance Equipment used:  Rolling walker (2 wheeled) Transfers: Sit to/from [employer] Sit to Stand: Min guard Stand pivot transfers: Min guard       General transfer comment: initial vc's for UE placement; mild increased effort to stand (x2 trials from bed and x1 trial from recliner)  Ambulation/Gait Ambulation/Gait assistance: Min guard;Supervision Gait Distance (Feet): 150 Feet Assistive device: Rolling walker (2 wheeled)   Gait velocity: decreased   General Gait Details: initially step to gait pattern progressing to partial step through gait pattern with vc's  Stairs Stairs: Yes Stairs assistance: Min guard Stair Management: One rail Right;Step to pattern;Sideways Number of Stairs: 5 General stair comments: initial vc's for technique; safe and steady  Wheelchair Mobility    Modified Rankin (Stroke Patients Only)       Balance Overall balance assessment: Needs assistance Sitting-balance support: No upper extremity supported;Feet supported Sitting balance-Leahy Scale: Normal Sitting balance - Comments: steady sitting reaching outside BOS   Standing balance support: No upper extremity supported Standing balance-Leahy Scale: Good Standing balance comment: steady standing pulling up shorts                             Pertinent Vitals/Pain Pain Assessment: 0-10 Pain Score: 3  Pain Location: R hip Pain Descriptors / Indicators: Aching;Sore Pain Intervention(s): Limited activity within patient's tolerance;Monitored during session;Premedicated before session;Repositioned;Ice applied  Vitals (HR and O2 on room air) stable and WFL throughout treatment session.    Home Living Family/patient expects to be discharged to:: Private residence Living Arrangements: Spouse/significant other  Available Help at Discharge: Family;Available 24 hours/day Type of Home: House Home Access: Stairs to enter Entrance Stairs-Rails: Doctor, general practice of Steps: 5 Home  Layout: Two level;1/2 bath on main level (plans to sleep on couch on main floor initially (same as pt did for surgery on last hip replacement)) Home Equipment: Grab bars - tub/shower;Shower seat - built in;Walker - 2 wheels;Cane - single point;Cane - quad;Bedside commode Additional Comments: may also have shower seat (but can get one easily if not)    Prior Function Level of Independence: Independent with assistive device(s)         Comments: Pt using RW or cane at night (d/t R hip pain) but no AD during day     Hand Dominance        Extremity/Trunk Assessment   Upper Extremity Assessment Upper Extremity Assessment: Overall WFL for tasks assessed    Lower Extremity Assessment Lower Extremity Assessment: RLE deficits/detail (L LE WFL) RLE Deficits / Details: at least 3-/5 R hip flexion (limited d/t R hip pain); at least 3/5 knee flexion/extension and DF/PF RLE: Unable to fully assess due to pain    Cervical / Trunk Assessment Cervical / Trunk Assessment: Normal  Communication   Communication: No difficulties  Cognition Arousal/Alertness: Awake/alert Behavior During Therapy: WFL for tasks assessed/performed Overall Cognitive Status: Within Functional Limits for tasks assessed                                        General Comments General comments (skin integrity, edema, etc.): R hip wound vac in place.  Pt issued LE HEP (anterior THR) and pt verbalizing and demonstrating appropriate understanding.  Pt's nurse called [facility] to check on set-up of physical therapy upon hospital discharge (pt and pt's wife educated to call ortho MD's clinic tomorrow if still needing to set up physical therapy).    Exercises Total Joint Exercises Ankle Circles/Pumps: AROM;Strengthening;Both;10 reps;Supine Quad Sets: AROM;Strengthening;Both;10 reps;Supine Towel Squeeze: AROM;Strengthening;Both;10 reps;Supine Short Arc Quad: AROM;Strengthening;Right;10  reps;Supine Heel Slides: AAROM;Strengthening;Right;10 reps;Supine Hip ABduction/ADduction: AAROM;Strengthening;Right;10 reps;Supine Long Arc Quad: AROM;Strengthening;Right;10 reps;Seated Knee Flexion: AROM;Strengthening;Right;10 reps;Seated (decreased R hip AROM)   Assessment/Plan    PT Assessment Patient needs continued PT services  PT Problem List Decreased strength;Decreased activity tolerance;Decreased balance;Decreased mobility;Decreased knowledge of precautions;Pain;Decreased skin integrity       PT Treatment Interventions DME instruction;Gait training;Stair training;Functional mobility training;Therapeutic activities;Therapeutic exercise;Balance training;Patient/family education    PT Goals (Current goals can be found in the Care Plan section)  Acute Rehab PT Goals Patient Stated Goal: to go home PT Goal Formulation: With patient Time For Goal Achievement: 09/14/20 Potential to Achieve Goals: Good    Frequency BID   Barriers to discharge        Co-evaluation               AM-PAC PT "6 Clicks" Mobility  Outcome Measure Help needed turning from your back to your side while in a flat bed without using bedrails?: A Little Help needed moving from lying on your back to sitting on the side of a flat bed without using bedrails?: A Little Help needed moving to and from a bed to a chair (including a wheelchair)?: A Little Help needed standing up from a chair using your arms (e.g., wheelchair or bedside chair)?: A Little Help needed to walk in hospital room?: A Little Help needed climbing 3-5 steps with a  railing? : A Little 6 Click Score: 18    End of Session Equipment Utilized During Treatment: Gait belt Activity Tolerance: Patient tolerated treatment well Patient left: in chair;with call bell/phone within reach;with family/visitor present;Other (comment) (ice pack to R hip) Nurse Communication: Mobility status;Precautions;Weight bearing status PT Visit Diagnosis:  Other abnormalities of gait and mobility (R26.89);Muscle weakness (generalized) (M62.81);Difficulty in walking, not elsewhere classified (R26.2);Pain Pain - Right/Left: Right Pain - part of body: Hip    Time: 2025-4270 PT Time Calculation (min) (ACUTE ONLY): 64 min   Charges:   PT Evaluation $PT Eval Low Complexity: 1 Low PT Treatments $Gait Training: 23-37 mins $Therapeutic Exercise: 8-22 mins       Hendricks Limes, PT 08/31/20, 4:47 PM

## 2020-09-01 ENCOUNTER — Encounter: Payer: Self-pay | Admitting: Orthopedic Surgery

## 2020-09-02 LAB — SURGICAL PATHOLOGY

## 2020-09-02 NOTE — Anesthesia Postprocedure Evaluation (Signed)
Anesthesia Post Note  Patient: Dennis Davidson  Procedure(s) Performed: TOTAL HIP ARTHROPLASTY ANTERIOR APPROACH (Right Hip)  Anesthesia Type: Spinal Anesthetic complications: no Comments: Pt discharged to home   No complications documented.   Last Vitals:  Vitals:   08/31/20 1300 08/31/20 1328  BP: (!) 109/54 114/69  Pulse: 71 77  Resp: (!) 25 16  Temp: 36.4 C 36.4 C  SpO2: 100% 100%    Last Pain:  Vitals:   09/01/20 0919  TempSrc:   PainSc: 8                  Jules Schick

## 2020-11-02 IMAGING — XA DG HIP (WITH PELVIS) OPERATIVE*R*
2 series · 5 of 5 positions shown · non-contrast
Comparison: None.

FLUOROSCOPY TIME:  0 minutes 24 seconds; 2 acquired images

CLINICAL DATA: Status post right total hip replacement

EXAM:
OPERATIVE RIGHT HIP  1 VIEW
TECHNIQUE: Fluoroscopic spot image(s) were submitted for interpretation
post-operatively.

[Series 7: ortho standard · 1 of 1 slices shown (1 of 2)]
[im 1/1]
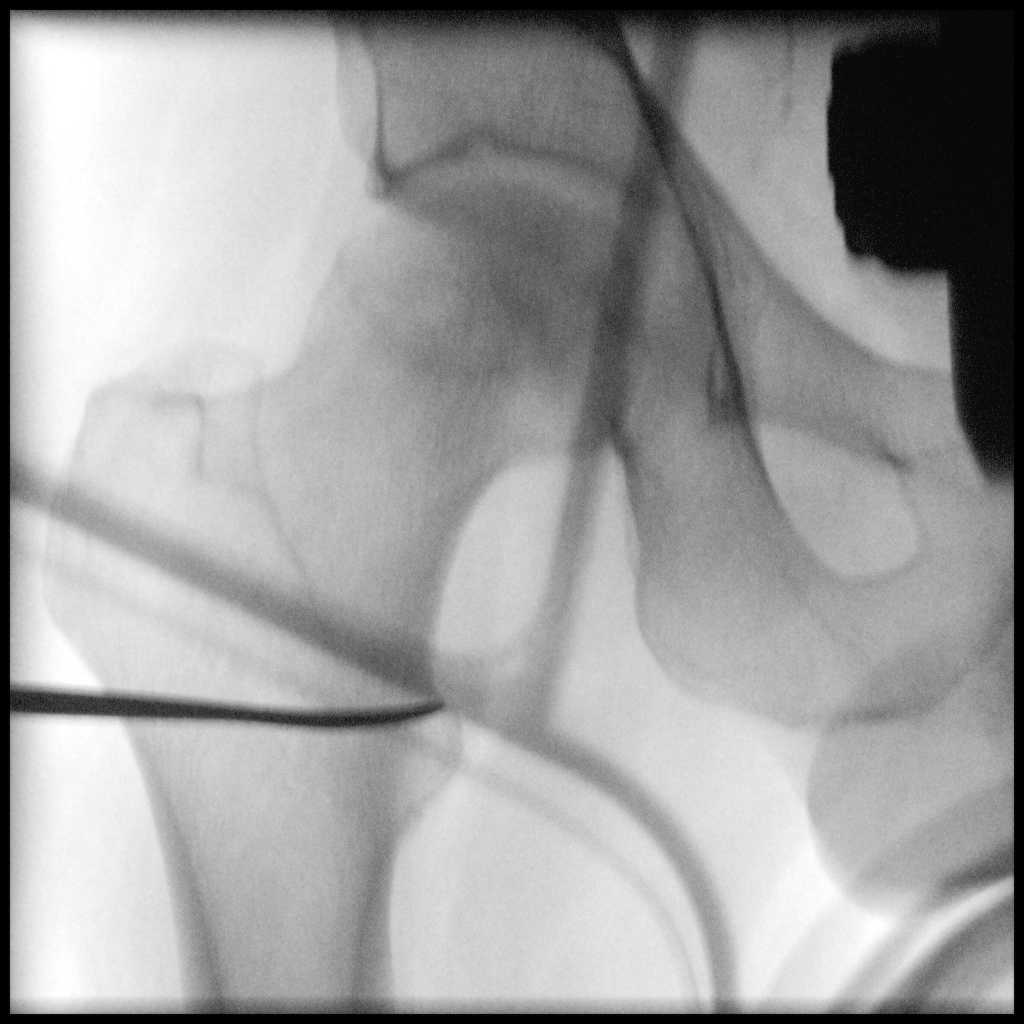

[Series 13: ortho standard · 4 of 12 frames shown (2 of 2)]
[frame 1/12]
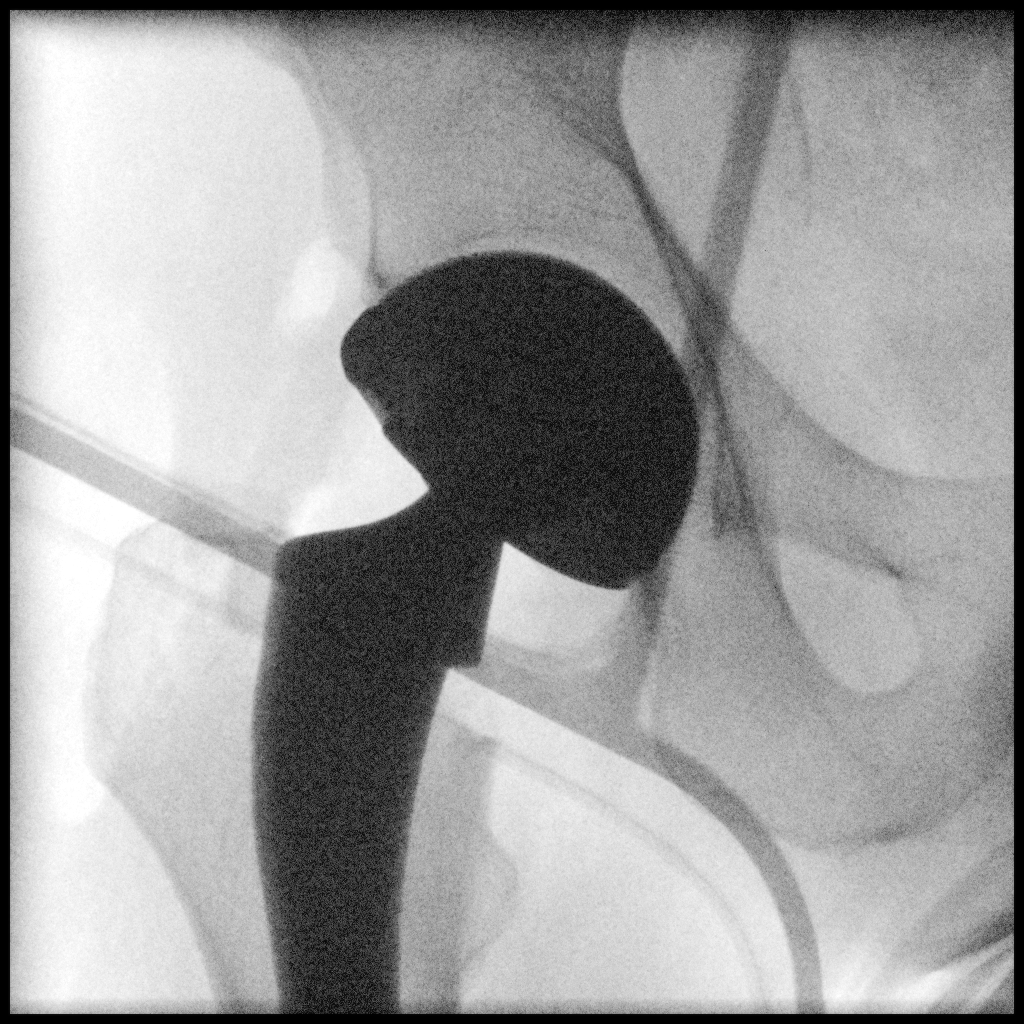
[frame 2/12]
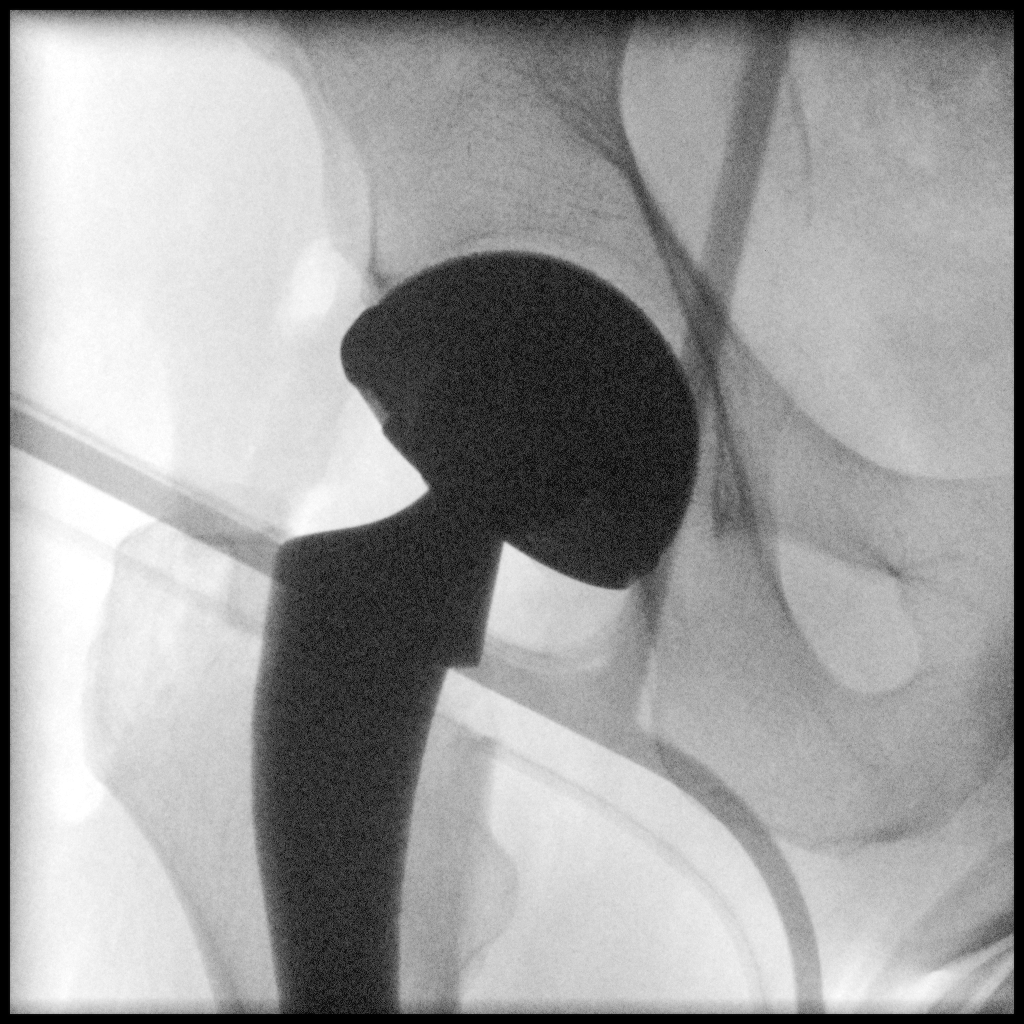
[frame 7/12]
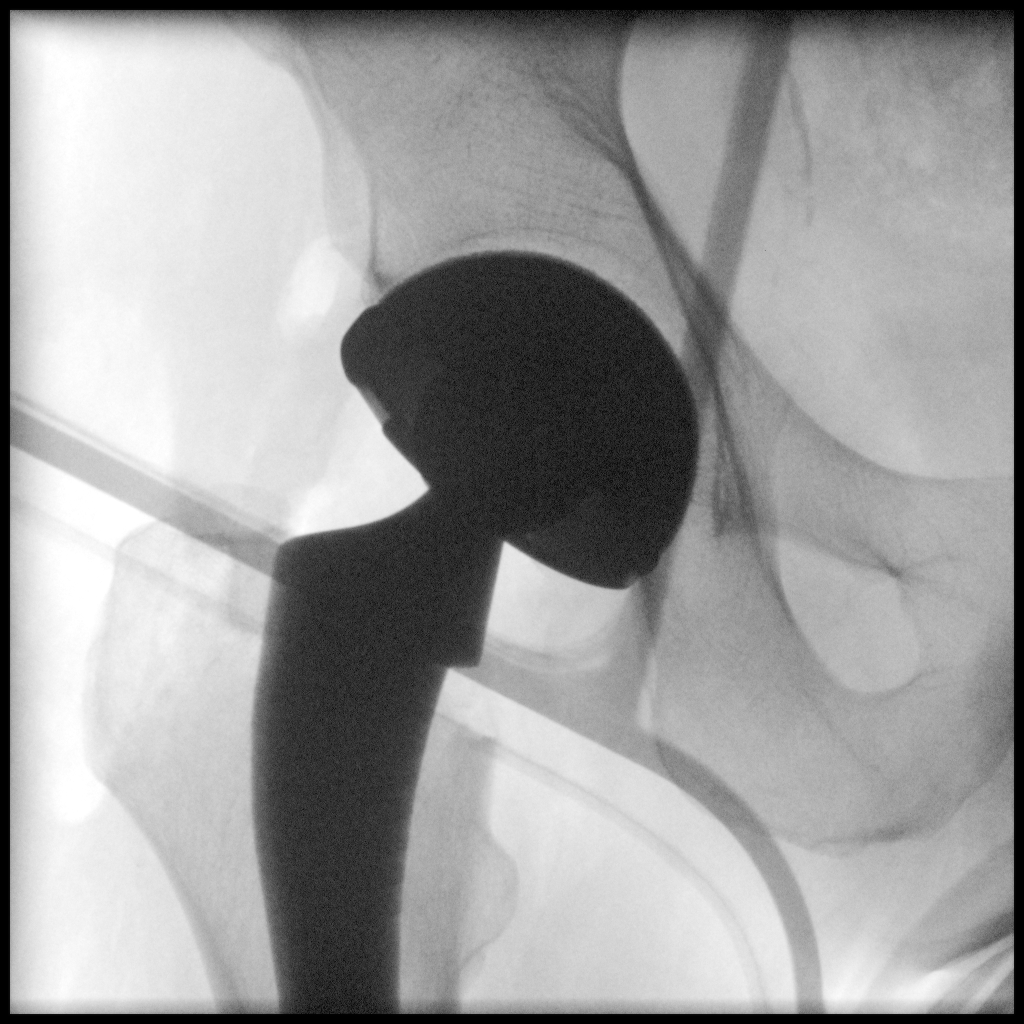
[frame 11/12]
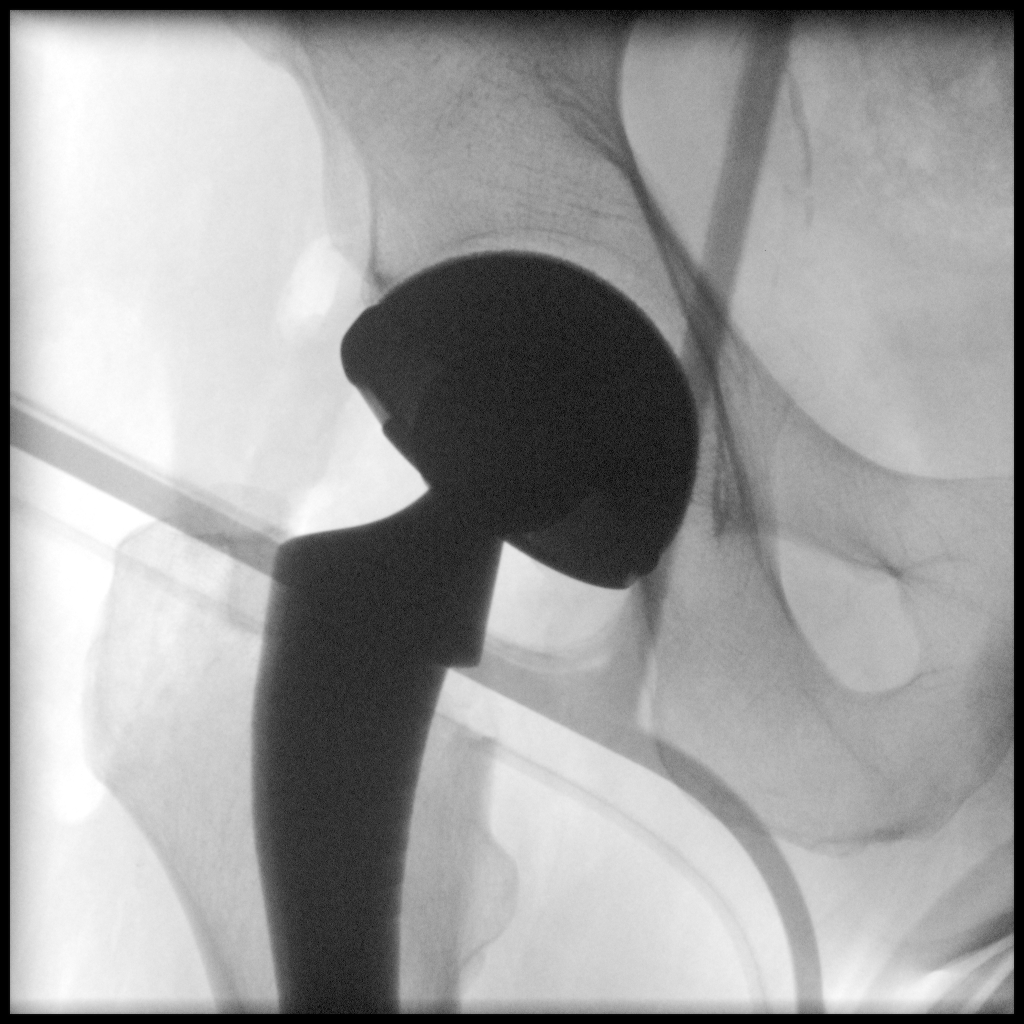

[5 of 5 positions shown; findings below may reference images not displayed]

FINDINGS: Frontal view shows total hip replacement with visualized prosthetic
components appearing well-seated on frontal view. No fracture or
dislocation evident.
IMPRESSION: Total hip replacement on the right with visualized prosthetic
components well-seated on frontal view. No fracture or dislocation.

## 2020-11-05 ENCOUNTER — Other Ambulatory Visit: Payer: Self-pay | Admitting: Orthopedic Surgery

## 2020-11-05 DIAGNOSIS — M87859 Other osteonecrosis, unspecified femur: Secondary | ICD-10-CM

## 2020-11-05 DIAGNOSIS — M25561 Pain in right knee: Secondary | ICD-10-CM

## 2020-11-12 ENCOUNTER — Other Ambulatory Visit: Payer: Self-pay

## 2020-11-12 ENCOUNTER — Ambulatory Visit
Admission: RE | Admit: 2020-11-12 | Discharge: 2020-11-12 | Disposition: A | Discharge status: Home / Self Care | Payer: Managed Care, Other (non HMO) | Source: Ambulatory Visit | Attending: Orthopedic Surgery | Admitting: Orthopedic Surgery

## 2020-11-12 DIAGNOSIS — M25561 Pain in right knee: Secondary | ICD-10-CM | POA: Diagnosis present

## 2020-11-12 DIAGNOSIS — M87859 Other osteonecrosis, unspecified femur: Secondary | ICD-10-CM

## 2021-06-07 DIAGNOSIS — D649 Anemia, unspecified: Secondary | ICD-10-CM | POA: Insufficient documentation

## 2021-06-07 DIAGNOSIS — E538 Deficiency of other specified B group vitamins: Secondary | ICD-10-CM | POA: Insufficient documentation

## 2021-06-07 DIAGNOSIS — D509 Iron deficiency anemia, unspecified: Secondary | ICD-10-CM | POA: Insufficient documentation

## 2021-10-10 ENCOUNTER — Emergency Department
Admission: EM | Admit: 2021-10-10 | Discharge: 2021-10-10 | Disposition: A | Discharge status: Home / Self Care | Payer: Worker's Compensation | Attending: Emergency Medicine | Admitting: Emergency Medicine

## 2021-10-10 ENCOUNTER — Other Ambulatory Visit: Payer: Self-pay

## 2021-10-10 DIAGNOSIS — I2510 Atherosclerotic heart disease of native coronary artery without angina pectoris: Secondary | ICD-10-CM | POA: Diagnosis not present

## 2021-10-10 DIAGNOSIS — Z87891 Personal history of nicotine dependence: Secondary | ICD-10-CM | POA: Insufficient documentation

## 2021-10-10 DIAGNOSIS — Z955 Presence of coronary angioplasty implant and graft: Secondary | ICD-10-CM | POA: Insufficient documentation

## 2021-10-10 DIAGNOSIS — Z79899 Other long term (current) drug therapy: Secondary | ICD-10-CM | POA: Insufficient documentation

## 2021-10-10 DIAGNOSIS — I10 Essential (primary) hypertension: Secondary | ICD-10-CM | POA: Insufficient documentation

## 2021-10-10 DIAGNOSIS — Z96643 Presence of artificial hip joint, bilateral: Secondary | ICD-10-CM | POA: Diagnosis not present

## 2021-10-10 DIAGNOSIS — Z026 Encounter for examination for insurance purposes: Secondary | ICD-10-CM | POA: Insufficient documentation
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
## 2021-11-14 ENCOUNTER — Encounter: Payer: Self-pay | Admitting: Physician Assistant

## 2021-11-14 ENCOUNTER — Other Ambulatory Visit: Payer: Self-pay

## 2021-11-14 ENCOUNTER — Ambulatory Visit: Payer: 59 | Admitting: Physician Assistant

## 2021-11-14 VITALS — Temp 98.4°F | Resp 14 | Ht 68.0 in | Wt 185.0 lb

## 2021-11-14 DIAGNOSIS — Z0000 Encounter for general adult medical examination without abnormal findings: Secondary | ICD-10-CM

## 2021-11-14 NOTE — Progress Notes (Signed)
[facility]   ____________________________________________   None    (approximate)  I have reviewed the triage vital signs and the nursing notes.   HISTORY  Chief Complaint Fit for duty Eval   HPI Dennis Davidson is a 45 y.o. male patient presents for fit for duty evaluation secondary to failing a POPAT exam 3 days ago.  Patient states 3 days prior to the test he was feeling "under the weather" and suspect he had a viral illness.  Patient states over the weekend his condition improved.  States unable to retake test until cleared by medical personnel.           Past Medical History:  Diagnosis Date   Anxiety    Arthritis    Coronary artery disease    Depression    Dysrhythmia    slight irregularity   Headache    secondary to bulging disc. 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   Low back pain 04/24/2019   Pain of left hip joint 04/24/2019   Cervical myelopathy (HCC) 08/05/2018   Cervical disc disorder at C5-C6 level with radiculopathy 07/18/2018   Arrhythmia 06/20/2017   CAD in native artery 12/01/2016   HTN, goal below 140/80 12/01/2016   Mixed hyperlipidemia 12/01/2016   Situational mixed anxiety and depressive disorder 12/01/2016    Past Surgical History:  Procedure Laterality Date   ANTERIOR CERVICAL DECOMP/DISCECTOMY FUSION N/A 08/05/2018   Procedure: ANTERIOR CERVICAL DECOMPRESSION/DISCECTOMY FUSION 2 LEVELS-C4-5, C5-6;  Surgeon: [name], MD;  Location: [facility] ORS;  Service: Neurosurgery;  Laterality: N/A;   CARDIAC CATHETERIZATION Left 09/07/2016   Procedure: Left Heart Cath and Coronary Angiography;  Surgeon: Alwyn Pea, MD;  Location: [facility];  Service: Cardiovascular;  Laterality: Left;   JOINT REPLACEMENT     MOUTH SURGERY     teeth  pulled. impacted molars   TOTAL HIP ARTHROPLASTY Left 07/01/2019   Procedure: LEFT TOTAL HIP ARTHROPLASTY;  Surgeon: Kennedy Bucker, MD;  Location: [facility] OR[facility];  Service: Orthopedics;  Laterality: Left;   TOTAL HIP ARTHROPLASTY Right 08/31/2020   Procedure: TOTAL HIP ARTHROPLASTY ANTERIOR APPROACH;  Surgeon: Kennedy Bucker, MD;  Location: [facility] OR[facility];  Service: Orthopedics;  Laterality: Right;    Prior to Admission medications   Medication Sig Start Date End Date Taking? Authorizing Provider  ALPRAZolam Prudy Feeler) 0.5 MG tablet Take 0.5 mg by mouth 3 (three) times daily as needed for anxiety.    Yes [provider]  atorvastatin (LIPITOR) 80 MG tablet Take 1 tablet by mouth daily. 07/05/21  Yes [provider]  cetirizine (ZYRTEC) 10 MG tablet Take 10 mg by mouth daily as needed for allergies.   Yes [provider]  cyanocobalamin (,VITAMIN B-12,) 1000 MCG/ML injection Inject into the muscle. 08/25/20  Yes [provider]  escitalopram (LEXAPRO) 20 MG tablet Take 20 mg by mouth daily. 11/13/16  Yes [provider]  lisinopril-hydrochlorothiazide (PRINZIDE,ZESTORETIC) 10-12.5 MG tablet TAKE ONE TABLET EVERY DAY Patient taking differently: Take 1 tablet by mouth daily. 08/20/17  Yes Fisher, Roselyn Bering, PA-C  metoprolol tartrate (LOPRESSOR) 25 MG tablet Take 25 mg by mouth 2 (two) times daily.   Yes [provider]  Multiple Vitamin (MULTIVITAMIN WITH MINERALS) TABS tablet Take 1 tablet by mouth daily.  Yes [provider]    Allergies Patient has no known allergies.  Family History  Problem Relation Age of Onset   Diabetes Mother    Diabetes Father     Social History Social History   Tobacco Use   Smoking status: Former    Packs/day: 0.25    Types: Cigarettes    Quit date: 12/08/2007    Years since quitting: 13.9   Smokeless tobacco: Never  Vaping Use   Vaping Use: Never used  Substance Use Topics   Alcohol use: Yes    Alcohol/week:  0.0 standard drinks    Comment: 1-2 month   Drug use: No    Review of Systems   Constitutional: No fever/chills Eyes: No visual changes. ENT: No sore throat. Cardiovascular: Denies chest pain. Respiratory: Denies shortness of breath. Gastrointestinal: No abdominal pain.  No nausea, no vomiting.  No diarrhea.  No constipation. Genitourinary: Negative for dysuria. Musculoskeletal: Cervical disc disease  skin: Negative for rash. Neurological: Negative for headaches, focal weakness or numbness. Psychiatric: Anxiety and depression Endocrine: Hyperlipidemia hypertension   ____________________________________________   PHYSICAL EXAM:  VITAL SIGNS:  1536 98.4 F (36.9 C) -- 14 -- -- 185 lb (83.9 kg) 28.1 CFL   Constitutional: Alert and oriented. Well appearing and in no acute distress. Eyes: Conjunctivae are normal. PERRL. EOMI. Head: Atraumatic. Nose: No congestion/rhinnorhea. Mouth/Throat: Mucous membranes are moist.  Oropharynx non-erythematous. Neck: No stridor.  No cervical spine tenderness to palpation. Hematological/Lymphatic/Immunilogical: No cervical lymphadenopathy. Cardiovascular: Normal rate, regular rhythm. Grossly normal heart sounds.  Good peripheral circulation. Respiratory: Normal respiratory effort.  No retractions. Lungs CTAB. Gastrointestinal: Soft and nontender. No distention. No abdominal bruits. No CVA tenderness. Genitourinary: Deferred Musculoskeletal: No lower extremity tenderness nor edema.  No joint effusions. Neurologic:  Normal speech and language. No gross focal neurologic deficits are appreciated. No gait instability. Skin:  Skin is warm, dry and intact. No rash noted. Psychiatric: Mood and affect are normal. Speech and behavior are normal.  ____________________________________________   LABS (all labs ordered are listed, but only abnormal results are displayed)   ____________________________________________  EKG  Sinus  Rhythm  -Left  atrial enlargement.   BORDERLINE ____________________________________________  RADIOLOGY Margarite Gouge, personally viewed and evaluated these images (plain radiographs) as part of my medical decision making, as well as reviewing the written report by the radiologist.  ED MD interpretation:    Official radiology report(s): No results found.  ____________________________________________   PROCEDURES  Procedure(s) performed (including Critical Care):  Procedures   ____________________________________________   INITIAL IMPRESSION / ASSESSMENT AND PLAN / ED COURSE  As part of my medical decision making, I reviewed the following data within the electronic MEDICAL RECORD NUMBER  EKG and pulse ox            ____________________________________________   FINAL CLINICAL IMPRESSION(S) / ED DIAGNOSES  Patient is medically cleared to retake physical exam   ED Discharge Orders          Ordered    EKG 12-Lead        11/14/21 1535             Note:  This document was prepared using Dragon voice recognition software and may include unintentional dictation errors.

## 2021-11-14 NOTE — Progress Notes (Signed)
Pt presents today for evaluation for work./CL,RMA

## 2023-09-21 ENCOUNTER — Other Ambulatory Visit: Payer: Self-pay | Admitting: Internal Medicine

## 2023-09-21 DIAGNOSIS — R42 Dizziness and giddiness: Secondary | ICD-10-CM

## 2023-09-25 ENCOUNTER — Other Ambulatory Visit: Payer: Self-pay | Admitting: Family Medicine

## 2023-09-25 DIAGNOSIS — G959 Disease of spinal cord, unspecified: Secondary | ICD-10-CM

## 2023-09-25 DIAGNOSIS — M5412 Radiculopathy, cervical region: Secondary | ICD-10-CM

## 2023-09-27 ENCOUNTER — Inpatient Hospital Stay: Payer: 59 | Attending: Oncology | Admitting: Oncology

## 2023-09-27 ENCOUNTER — Inpatient Hospital Stay: Payer: 59

## 2023-09-27 ENCOUNTER — Encounter: Payer: Self-pay | Admitting: Oncology

## 2023-09-27 VITALS — BP 139/82 | HR 88 | Temp 97.8°F | Resp 18 | Wt 225.5 lb

## 2023-09-27 DIAGNOSIS — D649 Anemia, unspecified: Secondary | ICD-10-CM

## 2023-09-27 DIAGNOSIS — D696 Thrombocytopenia, unspecified: Secondary | ICD-10-CM

## 2023-09-27 DIAGNOSIS — Z87891 Personal history of nicotine dependence: Secondary | ICD-10-CM | POA: Insufficient documentation

## 2023-09-27 LAB — CBC WITH DIFFERENTIAL/PLATELET
Abs Immature Granulocytes: 0.09 10*3/uL — ABNORMAL HIGH (ref 0.00–0.07)
Basophils Absolute: 0 10*3/uL (ref 0.0–0.1)
Basophils Relative: 0 %
Eosinophils Absolute: 0.1 10*3/uL (ref 0.0–0.5)
Eosinophils Relative: 2 %
HCT: 25 % — ABNORMAL LOW (ref 39.0–52.0)
Hemoglobin: 7.7 g/dL — ABNORMAL LOW (ref 13.0–17.0)
Immature Granulocytes: 2 %
Lymphocytes Relative: 12 %
Lymphs Abs: 0.7 10*3/uL (ref 0.7–4.0)
MCH: 27.2 pg (ref 26.0–34.0)
MCHC: 30.8 g/dL (ref 30.0–36.0)
MCV: 88.3 fL (ref 80.0–100.0)
Monocytes Absolute: 0.4 10*3/uL (ref 0.1–1.0)
Monocytes Relative: 6 %
Neutro Abs: 4.5 10*3/uL (ref 1.7–7.7)
Neutrophils Relative %: 78 %
Platelets: 152 10*3/uL (ref 150–400)
RBC: 2.83 MIL/uL — ABNORMAL LOW (ref 4.22–5.81)
RDW: 18.1 % — ABNORMAL HIGH (ref 11.5–15.5)
WBC: 5.8 10*3/uL (ref 4.0–10.5)
nRBC: 0 % (ref 0.0–0.2)

## 2023-09-27 LAB — RETIC PANEL
Immature Retic Fract: 34.6 % — ABNORMAL HIGH (ref 2.3–15.9)
RBC.: 2.86 MIL/uL — ABNORMAL LOW (ref 4.22–5.81)
Retic Count, Absolute: 125 10*3/uL (ref 19.0–186.0)
Retic Ct Pct: 4.4 % — ABNORMAL HIGH (ref 0.4–3.1)
Reticulocyte Hemoglobin: 32.3 pg (ref >27.9)

## 2023-09-27 LAB — COMPREHENSIVE METABOLIC PANEL
ALT: 34 U/L (ref 0–44)
AST: 45 U/L — ABNORMAL HIGH (ref 15–41)
Albumin: 3.9 g/dL (ref 3.5–5.0)
Alkaline Phosphatase: 63 U/L (ref 38–126)
Anion gap: 10 (ref 5–15)
BUN: 11 mg/dL (ref 6–20)
CO2: 25 mmol/L (ref 22–32)
Calcium: 9.1 mg/dL (ref 8.9–10.3)
Chloride: 98 mmol/L (ref 98–111)
Creatinine, Ser: 0.89 mg/dL (ref 0.61–1.24)
GFR, Estimated: >60 mL/min (ref >60)
Glucose, Bld: 149 mg/dL — ABNORMAL HIGH (ref 70–99)
Potassium: 3.3 mmol/L — ABNORMAL LOW (ref 3.5–5.1)
Sodium: 133 mmol/L — ABNORMAL LOW (ref 135–145)
Total Bilirubin: 0.6 mg/dL (ref 0.3–1.2)
Total Protein: 6.8 g/dL (ref 6.5–8.1)

## 2023-09-27 LAB — FERRITIN: Ferritin: 19 ng/mL — ABNORMAL LOW (ref 24–336)

## 2023-09-27 LAB — IRON AND TIBC
Iron: 46 ug/dL (ref 45–182)
Saturation Ratios: 9 % — ABNORMAL LOW (ref 17.9–39.5)
TIBC: 538 ug/dL — ABNORMAL HIGH (ref 250–450)
UIBC: 492 ug/dL

## 2023-09-27 LAB — IMMATURE PLATELET FRACTION: Immature Platelet Fraction: 2.9 % (ref 1.2–8.6)

## 2023-09-27 LAB — LACTATE DEHYDROGENASE: LDH: 139 U/L (ref 98–192)

## 2023-09-27 NOTE — Progress Notes (Signed)
Hematology/Oncology Consult Note [phone] [phone]     REFERRING PROVIDER: Ignacia Bayley, PA-C  CHIEF COMPLAINTS/REASON FOR VISIT:  Evaluation of thrombocytopenia and anemia  ASSESSMENT & PLAN:   Thrombocytopenia (HCC) For the work up of patient's thrombocytopenia, I recommend checking CBC;CMP, immature platelet fraction, LDH; smear review, folate, Vitamin B12, hepatitis, HIV, flowcytometry and monoclonal gammopathy workup.    Normocytic anemia Chronic, progressive decrease in hemoglobin and platelet counts. Previous workup including vitamin B12, folate, and iron panel inconclusive. Recent use of NSAIDs (Voltaren and ibuprofen) Check cbc, iron tibc ferritin, MMA, SPEP   Orders Placed This Encounter  Procedures   Ferritin    Standing Status:   Future    Number of Occurrences:   1    Standing Expiration Date:   03/26/2024   Iron and TIBC    Standing Status:   Future    Number of Occurrences:   1    Standing Expiration Date:   09/26/2024   Retic Panel    Standing Status:   Future    Number of Occurrences:   1    Standing Expiration Date:   09/26/2024   CBC with Differential/Platelet    Standing Status:   Future    Number of Occurrences:   1    Standing Expiration Date:   09/26/2024   Comprehensive metabolic panel    Standing Status:   Future    Number of Occurrences:   1    Standing Expiration Date:   09/26/2024   Multiple Myeloma Panel (SPEP&IFE w/QIG)    Standing Status:   Future    Number of Occurrences:   1    Standing Expiration Date:   09/26/2024   Kappa/lambda light chains    Standing Status:   Future    Number of Occurrences:   1    Standing Expiration Date:   09/26/2024   Flow cytometry panel-leukemia/lymphoma work-up    Standing Status:   Future    Number of Occurrences:   1    Standing Expiration Date:   09/26/2024   Lactate dehydrogenase    Standing Status:   Future    Number of Occurrences:   1    Standing Expiration Date:    09/26/2024   HIV Antibody (routine testing w rflx)    Standing Status:   Future    Number of Occurrences:   1    Standing Expiration Date:   09/26/2024   Hepatitis panel, acute    Standing Status:   Future    Number of Occurrences:   1    Standing Expiration Date:   09/26/2024   BCR-ABL1 FISH    Standing Status:   Future    Number of Occurrences:   1    Standing Expiration Date:   09/26/2024   Methylmalonic acid, serum    Standing Status:   Future    Number of Occurrences:   1    Standing Expiration Date:   09/26/2024   Technologist smear review    Standing Status:   Future    Number of Occurrences:   1    Standing Expiration Date:   09/26/2024    Order Specific Question:   Clinical information:    Answer:   anmeia and thrombocytopenia   Immature Platelet Fraction    Standing Status:   Future    Number of Occurrences:   1    Standing Expiration Date:   09/26/2024    All questions were answered.  The patient knows to call the clinic with any problems, questions or concerns.  Rickard Patience, MD, PhD [facility] Hematology Oncology 09/27/2023   HISTORY OF PRESENTING ILLNESS:  Dennis Davidson is a 47 y.o. 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 His blood work cbc showed a gradual decrease in hemoglobin and platelet counts over the past few years, despite normal levels of vitamin B12 and folate.   Recently, the patient trialed a prescription NSAID, Voltaren, for about a week, in addition to his regular use of ibuprofen for back pain. He discontinued the Voltaren shortly before his most recent labs, which showed alarming results. The patient denies any new medications or over-the-counter supplements.  He reports no night sweats, unintentional weight loss, or fever. 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 The patient denies any other physical symptoms apart from joint pain and arthritis.  The patient has a history of multiple joint replacements and has recently started physical therapy for back pain. He has also been involved in a mental health program for frequent utilizers of the ED and nonviolent misdemeanors due to homelessness, mental illness, and co-occurring substance abuse.  The patient consumes alcohol on a very limited basis, mainly light beer, and does not smoke cigarettes. He has no history of GI bleeding.   Denies history hepatitis or HIV infection Denies history of chronic liver disease Denies routine alcohol consumption. Denies dietary restrictions.  Denies herbal medication    MEDICAL HISTORY:  Past Medical History:  Diagnosis Date   Anxiety    Arthritis    Coronary artery disease    Depression    Dysrhythmia    slight irregularity   Headache    secondary to bulging disc. better since surgery   Hyperlipidemia    Hypertension     SURGICAL HISTORY: Past Surgical History:  Procedure Laterality Date   ANTERIOR CERVICAL DECOMP/DISCECTOMY FUSION N/A 08/05/2018   Procedure: ANTERIOR CERVICAL DECOMPRESSION/DISCECTOMY FUSION 2 LEVELS-C4-5, C5-6;  Surgeon: [name], MD;  Location: [facility] ORS;  Service: Neurosurgery;  Laterality: N/A;   CARDIAC CATHETERIZATION Left 09/07/2016   Procedure: Left Heart Cath and Coronary Angiography;  Surgeon: Alwyn Pea, MD;  Location: [facility];  Service: Cardiovascular;  Laterality: Left;   JOINT REPLACEMENT     MOUTH SURGERY     teeth pulled. impacted molars   TOTAL HIP ARTHROPLASTY Left 07/01/2019   Procedure: LEFT TOTAL HIP ARTHROPLASTY;  Surgeon: Kennedy Bucker, MD;  Location: [facility] ORS;  Service: Orthopedics;  Laterality: Left;   TOTAL HIP ARTHROPLASTY Right 08/31/2020   Procedure: TOTAL HIP ARTHROPLASTY ANTERIOR APPROACH;   Surgeon: Kennedy Bucker, MD;  Location: [facility] OR[facility];  Service: Orthopedics;  Laterality: Right;    SOCIAL HISTORY: Social History   Socioeconomic History   Marital status: Married    Spouse name: Robyn   Number of children: Not on file   Years of education: Not on file   Highest education level: Not on file  Occupational History   Occupation: Emergency planning/management officer  Tobacco Use   Smoking status: Former    Current packs/day: 0.00    Average packs/day: 0.3 packs/day for 8.0 years (2.0 ttl pk-yrs)    Types: Cigarettes    Start date: 2001    Quit  date: 12/08/2007    Years since quitting: 15.8   Smokeless tobacco: Never  Vaping Use   Vaping status: Never Used  Substance and Sexual Activity   Alcohol use: Yes    Comment: minimal   Drug use: No   Sexual activity: Not on file  Other Topics Concern   Not on file  Social History Narrative   ** Merged History Encounter **       Social Determinants of Health   Financial Resource Strain: Low Risk  (02/16/2023)   Received from [facility]   Overall Financial Resource Strain (CARDIA)    Difficulty of Paying Living Expenses: Not hard at all  Food Insecurity: No Food Insecurity (02/16/2023)   Received from [facility]   Hunger Vital Sign    Worried About Running Out of Food in the Last Year: Never true    Ran Out of Food in the Last Year: Never true  Transportation Needs: No Transportation Needs (02/16/2023)   Received from [facility] - Transportation    In the past 12 months, has lack of transportation kept you from medical appointments or from getting medications?: No    Lack of Transportation (Non-Medical): No  Physical Activity: Not on file  Stress: Not on file  Social Connections: Not on file  Intimate Partner Violence: Not on file    FAMILY HISTORY: Family History  Problem Relation Age of Onset   Diabetes Mother    Diabetes Father     ALLERGIES:  has No Known  Allergies.  MEDICATIONS:  Current Outpatient Medications  Medication Sig Dispense Refill   ALPRAZolam (XANAX) 0.5 MG tablet Take 0.5 mg by mouth 3 (three) times daily as needed for anxiety.      atorvastatin (LIPITOR) 80 MG tablet Take 1 tablet by mouth daily.     cetirizine (ZYRTEC) 10 MG tablet Take 10 mg by mouth daily as needed for allergies.     escitalopram (LEXAPRO) 20 MG tablet Take 20 mg by mouth daily.     lisinopril-hydrochlorothiazide (PRINZIDE,ZESTORETIC) 10-12.5 MG tablet TAKE ONE TABLET EVERY DAY (Patient taking differently: Take 1 tablet by mouth daily.) 90 tablet 3   metoprolol tartrate (LOPRESSOR) 25 MG tablet Take 25 mg by mouth 2 (two) times daily.     Multiple Vitamin (MULTIVITAMIN WITH MINERALS) TABS tablet Take 1 tablet by mouth daily.     No current facility-administered medications for this visit.    Review of Systems  Constitutional:  Positive for fatigue. Negative for appetite change, chills, fever and unexpected weight change.  HENT:   Negative for hearing loss and voice change.   Eyes:  Negative for eye problems and icterus.  Respiratory:  Negative for chest tightness, cough and shortness of breath.   Cardiovascular:  Negative for chest pain and leg swelling.  Gastrointestinal:  Negative for abdominal distention and abdominal pain.  Endocrine: Negative for hot flashes.  Genitourinary:  Negative for difficulty urinating, dysuria and frequency.   Musculoskeletal:  Positive for arthralgias and back pain.  Skin:  Negative for itching and rash.  Neurological:  Negative for light-headedness and numbness.  Hematological:  Negative for adenopathy. Does not bruise/bleed easily.  Psychiatric/Behavioral:  Negative for confusion.     PHYSICAL EXAMINATION: ECOG PERFORMANCE STATUS: 1 - Symptomatic but completely ambulatory Vitals:   09/27/23 1527  BP: 139/82  Pulse: 88  Resp: 18  Temp: 97.8 F (36.6 C)   Filed Weights   09/27/23 1527  Weight: 225 lb 8 oz  (102.3 kg)    Physical Exam Constitutional:      General: He is not in acute distress.    Appearance: He is obese.  HENT:     Head: Normocephalic and atraumatic.  Eyes:     General: No scleral icterus. Cardiovascular:     Rate and Rhythm: Normal rate and regular rhythm.     Heart sounds: Normal heart sounds.  Pulmonary:     Effort: Pulmonary effort is normal. No respiratory distress.     Breath sounds: No wheezing.  Abdominal:     General: Bowel sounds are normal. There is no distension.     Palpations: Abdomen is soft.  Musculoskeletal:        General: No deformity. Normal range of motion.     Cervical back: Normal range of motion and neck supple.  Skin:    General: Skin is warm and dry.     Findings: No erythema or rash.  Neurological:     Mental Status: He is alert and oriented to person, place, and time. Mental status is at baseline.     Cranial Nerves: No cranial nerve deficit.  Psychiatric:        Mood and Affect: Mood normal.      LABORATORY DATA:  I have reviewed the data as listed    Latest Ref Rng & Units 09/27/2023    4:03 PM 08/31/2020    2:32 PM 07/03/2019    8:47 AM  CBC  WBC 4.0 - 10.5 K/uL 5.8  13.4  10.1   Hemoglobin 13.0 - 17.0 g/dL 7.7  96.2  95.2   Hematocrit 39.0 - 52.0 % 25.0  31.0  31.9   Platelets 150 - 400 K/uL 152  206  164       Latest Ref Rng & Units 09/27/2023    4:03 PM 08/31/2020    2:32 PM 07/03/2019    8:47 AM  CMP  Glucose 70 - 99 mg/dL 841   324   BUN 6 - 20 mg/dL 11   7   Creatinine 4.01 - 1.24 mg/dL 0.27  2.53  6.64   Sodium 135 - 145 mmol/L 133   131   Potassium 3.5 - 5.1 mmol/L 3.3   4.1   Chloride 98 - 111 mmol/L 98   99   CO2 22 - 32 mmol/L 25   23   Calcium 8.9 - 10.3 mg/dL 9.1   8.4   Total Protein 6.5 - 8.1 g/dL 6.8     Total Bilirubin 0.3 - 1.2 mg/dL 0.6     Alkaline Phos 38 - 126 U/L 63     AST 15 - 41 U/L 45     ALT 0 - 44 U/L 34        RADIOGRAPHIC STUDIES: I have personally reviewed the radiological  images as listed and agreed with the findings in the report.  No results found.

## 2023-09-27 NOTE — Assessment & Plan Note (Signed)
For the work up of patient's thrombocytopenia, I recommend checking CBC;CMP, immature platelet fraction, LDH; smear review, folate, Vitamin B12, hepatitis, HIV, flowcytometry and monoclonal gammopathy workup.   

## 2023-09-27 NOTE — Assessment & Plan Note (Addendum)
Chronic, progressive decrease in hemoglobin and platelet counts. Previous workup including vitamin B12, folate, and iron panel inconclusive. Recent use of NSAIDs (Voltaren and ibuprofen) Check cbc, iron tibc ferritin, MMA, SPEP

## 2023-09-28 LAB — HIV ANTIBODY (ROUTINE TESTING W REFLEX): HIV Screen 4th Generation wRfx: NONREACTIVE

## 2023-09-28 LAB — KAPPA/LAMBDA LIGHT CHAINS
Kappa free light chain: 17.1 mg/L (ref 3.3–19.4)
Kappa, lambda light chain ratio: 1.32 (ref 0.26–1.65)
Lambda free light chains: 13 mg/L (ref 5.7–26.3)

## 2023-09-28 LAB — TECHNOLOGIST SMEAR REVIEW: Plt Morphology: NORMAL

## 2023-09-28 LAB — HEPATITIS PANEL, ACUTE
HCV Ab: NONREACTIVE
Hep A IgM: NONREACTIVE
Hep B C IgM: NONREACTIVE
Hepatitis B Surface Ag: NONREACTIVE

## 2023-10-01 ENCOUNTER — Encounter: Payer: Self-pay | Admitting: Internal Medicine

## 2023-10-01 LAB — BCR-ABL1 FISH
Cells Analyzed: 200
Cells Counted: 200

## 2023-10-02 LAB — COMP PANEL: LEUKEMIA/LYMPHOMA

## 2023-10-02 LAB — MULTIPLE MYELOMA PANEL, SERUM
Albumin SerPl Elph-Mcnc: 3.7 g/dL (ref 2.9–4.4)
Albumin/Glob SerPl: 1.4 (ref 0.7–1.7)
Alpha 1: 0.3 g/dL (ref 0.0–0.4)
Alpha2 Glob SerPl Elph-Mcnc: 0.6 g/dL (ref 0.4–1.0)
B-Globulin SerPl Elph-Mcnc: 1.1 g/dL (ref 0.7–1.3)
Gamma Glob SerPl Elph-Mcnc: 0.8 g/dL (ref 0.4–1.8)
Globulin, Total: 2.8 g/dL (ref 2.2–3.9)
IgA: 352 mg/dL (ref 90–386)
IgG (Immunoglobin G), Serum: 764 mg/dL (ref 603–1613)
IgM (Immunoglobulin M), Srm: 41 mg/dL (ref 20–172)
Total Protein ELP: 6.5 g/dL (ref 6.0–8.5)

## 2023-10-03 ENCOUNTER — Other Ambulatory Visit: Payer: Self-pay | Admitting: Oncology

## 2023-10-03 DIAGNOSIS — D509 Iron deficiency anemia, unspecified: Secondary | ICD-10-CM | POA: Insufficient documentation

## 2023-10-03 DIAGNOSIS — D500 Iron deficiency anemia secondary to blood loss (chronic): Secondary | ICD-10-CM

## 2023-10-03 LAB — METHYLMALONIC ACID, SERUM: Methylmalonic Acid, Quantitative: 232 nmol/L (ref 0–378)

## 2023-10-04 ENCOUNTER — Telehealth: Payer: Self-pay

## 2023-10-04 ENCOUNTER — Other Ambulatory Visit: Payer: Self-pay

## 2023-10-04 DIAGNOSIS — D500 Iron deficiency anemia secondary to blood loss (chronic): Secondary | ICD-10-CM

## 2023-10-04 NOTE — Telephone Encounter (Signed)
Pt has decided to proceed with IV iron.   Please schedule IV venofer weekly x4 (first dose is NEW)  10-12 weeks: labs prior to MD/venofer

## 2023-10-04 NOTE — Telephone Encounter (Signed)
-----   Message from Rickard Patience sent at 10/03/2023  9:14 PM EST ----- Please let patient know that his platelet count is normal, red blood cell count is low. This is due to low iron store.  I recommend IV Venofer treatments weekly x 4 to improve his iron store. IV Venofer is in general safe, with small risk of severe allergic reaction. He will be monitor after iron treatments.  If he agrees please arrange. Also arrange follow in 10-12 weeks labs prior to MD +/- Venfoer. Please order cbc iron tibc ferritin.  If he does not want to get iv iron, please recommend him to take oral iron supplementation. Please send him a Rx for ferrous sulfate 325mg  BID.  If so, he needs to be seen sooner for close monitoring the counts. Lab prior to MD in 6 weeks. Thanks.   zy

## 2023-10-06 ENCOUNTER — Ambulatory Visit
Admission: RE | Admit: 2023-10-06 | Discharge: 2023-10-06 | Disposition: A | Discharge status: Home / Self Care | Payer: 59 | Source: Ambulatory Visit | Attending: Internal Medicine | Admitting: Internal Medicine

## 2023-10-06 ENCOUNTER — Ambulatory Visit
Admission: RE | Admit: 2023-10-06 | Discharge: 2023-10-06 | Disposition: A | Discharge status: Home / Self Care | Payer: 59 | Source: Ambulatory Visit | Attending: Family Medicine | Admitting: Family Medicine

## 2023-10-06 DIAGNOSIS — R42 Dizziness and giddiness: Secondary | ICD-10-CM

## 2023-10-06 DIAGNOSIS — G959 Disease of spinal cord, unspecified: Secondary | ICD-10-CM

## 2023-10-06 DIAGNOSIS — M5412 Radiculopathy, cervical region: Secondary | ICD-10-CM

## 2023-10-08 ENCOUNTER — Other Ambulatory Visit: Payer: Self-pay | Admitting: Internal Medicine

## 2023-10-08 DIAGNOSIS — I700 Atherosclerosis of aorta: Secondary | ICD-10-CM

## 2023-10-08 DIAGNOSIS — M5440 Lumbago with sciatica, unspecified side: Secondary | ICD-10-CM

## 2023-10-11 ENCOUNTER — Inpatient Hospital Stay: Payer: 59 | Attending: Oncology

## 2023-10-11 VITALS — BP 115/56 | HR 85 | Temp 98.0°F | Resp 16

## 2023-10-11 DIAGNOSIS — D500 Iron deficiency anemia secondary to blood loss (chronic): Secondary | ICD-10-CM

## 2023-10-11 DIAGNOSIS — D649 Anemia, unspecified: Secondary | ICD-10-CM | POA: Diagnosis present

## 2023-10-11 DIAGNOSIS — E861 Hypovolemia: Secondary | ICD-10-CM

## 2023-10-11 DIAGNOSIS — R55 Syncope and collapse: Secondary | ICD-10-CM

## 2023-10-11 MED ORDER — IRON SUCROSE 20 MG/ML IV SOLN
200.0000 mg | Freq: Once | INTRAVENOUS | Status: AC
Start: 2023-10-11 — End: 2023-10-11
  Administered 2023-10-11: 200 mg via INTRAVENOUS
  Filled 2023-10-11: qty 10

## 2023-10-11 MED ORDER — SODIUM CHLORIDE 0.9% FLUSH
10.0000 mL | Freq: Once | INTRAVENOUS | Status: AC | PRN
  Administered 2023-10-11: 10 mL
  Filled 2023-10-11: qty 10

## 2023-10-11 MED ORDER — SODIUM CHLORIDE 0.9 % IV SOLN
INTRAVENOUS | Status: DC
  Filled 2023-10-11: qty 250

## 2023-10-11 NOTE — Progress Notes (Signed)
Patient here for 1st time Venofer.  Venofer given IV push over 5 minutes per protocol. Immediately after Venofer was done, patient became clammy and said he felt woozy. Patient was found to be hypotensive. (see vital signs flowsheets)  NS started at 999 ml/hr. Consuello Masse, NP called to chairside.  Per Consuello Masse, NP gave patient 1 liter of NS over 1 hour and monitored vital signs. BP recovered and patient was discharged in stable, ambulatory condition.

## 2023-10-16 ENCOUNTER — Ambulatory Visit
Admission: RE | Admit: 2023-10-16 | Discharge: 2023-10-16 | Disposition: A | Discharge status: Home / Self Care | Payer: 59 | Source: Ambulatory Visit | Attending: Internal Medicine | Admitting: Internal Medicine

## 2023-10-16 DIAGNOSIS — M5440 Lumbago with sciatica, unspecified side: Secondary | ICD-10-CM | POA: Insufficient documentation

## 2023-10-16 DIAGNOSIS — I700 Atherosclerosis of aorta: Secondary | ICD-10-CM | POA: Insufficient documentation

## 2023-10-18 ENCOUNTER — Inpatient Hospital Stay: Payer: 59

## 2023-10-18 VITALS — BP 125/65 | HR 84 | Temp 98.0°F | Resp 18

## 2023-10-18 DIAGNOSIS — D649 Anemia, unspecified: Secondary | ICD-10-CM | POA: Diagnosis not present

## 2023-10-18 DIAGNOSIS — D500 Iron deficiency anemia secondary to blood loss (chronic): Secondary | ICD-10-CM

## 2023-10-18 MED ORDER — SODIUM CHLORIDE 0.9% FLUSH
10.0000 mL | Freq: Once | INTRAVENOUS | Status: AC | PRN
  Administered 2023-10-18: 10 mL
  Filled 2023-10-18: qty 10

## 2023-10-18 MED ORDER — IRON SUCROSE 20 MG/ML IV SOLN
200.0000 mg | Freq: Once | INTRAVENOUS | Status: AC
  Administered 2023-10-18: 200 mg via INTRAVENOUS

## 2023-10-23 ENCOUNTER — Inpatient Hospital Stay: Payer: 59

## 2023-10-23 VITALS — BP 130/71 | HR 87 | Temp 99.5°F | Resp 20

## 2023-10-23 DIAGNOSIS — D649 Anemia, unspecified: Secondary | ICD-10-CM | POA: Diagnosis not present

## 2023-10-23 DIAGNOSIS — D500 Iron deficiency anemia secondary to blood loss (chronic): Secondary | ICD-10-CM

## 2023-10-23 MED ORDER — SODIUM CHLORIDE 0.9% FLUSH
10.0000 mL | Freq: Once | INTRAVENOUS | Status: AC | PRN
  Administered 2023-10-23: 10 mL
  Filled 2023-10-23: qty 10

## 2023-10-23 MED ORDER — IRON SUCROSE 20 MG/ML IV SOLN
200.0000 mg | Freq: Once | INTRAVENOUS | Status: AC
  Administered 2023-10-23: 200 mg via INTRAVENOUS
  Filled 2023-10-23: qty 10

## 2023-11-01 ENCOUNTER — Inpatient Hospital Stay: Payer: 59 | Attending: Oncology

## 2023-11-01 VITALS — BP 160/89 | HR 98 | Resp 18

## 2023-11-01 DIAGNOSIS — D500 Iron deficiency anemia secondary to blood loss (chronic): Secondary | ICD-10-CM

## 2023-11-01 DIAGNOSIS — D649 Anemia, unspecified: Secondary | ICD-10-CM | POA: Diagnosis present

## 2023-11-01 MED ORDER — IRON SUCROSE 20 MG/ML IV SOLN
200.0000 mg | Freq: Once | INTRAVENOUS | Status: AC
  Administered 2023-11-01: 200 mg via INTRAVENOUS
  Filled 2023-11-01: qty 10

## 2023-11-02 ENCOUNTER — Other Ambulatory Visit: Payer: Self-pay | Admitting: Family Medicine

## 2023-11-02 DIAGNOSIS — M5412 Radiculopathy, cervical region: Secondary | ICD-10-CM

## 2023-11-05 ENCOUNTER — Encounter: Payer: Self-pay | Admitting: Oncology

## 2023-11-09 ENCOUNTER — Ambulatory Visit: Payer: 59

## 2023-11-09 DIAGNOSIS — D123 Benign neoplasm of transverse colon: Secondary | ICD-10-CM

## 2023-11-09 DIAGNOSIS — Z83719 Family history of colon polyps, unspecified: Secondary | ICD-10-CM | POA: Diagnosis not present

## 2023-11-09 DIAGNOSIS — K621 Rectal polyp: Secondary | ICD-10-CM | POA: Diagnosis not present

## 2023-11-09 DIAGNOSIS — K640 First degree hemorrhoids: Secondary | ICD-10-CM | POA: Diagnosis not present

## 2023-11-09 DIAGNOSIS — Z1211 Encounter for screening for malignant neoplasm of colon: Secondary | ICD-10-CM

## 2023-12-06 ENCOUNTER — Other Ambulatory Visit: Payer: 59

## 2023-12-10 ENCOUNTER — Inpatient Hospital Stay: Payer: 59 | Admitting: Oncology

## 2024-01-01 ENCOUNTER — Inpatient Hospital Stay: Payer: 59 | Attending: Oncology | Admitting: Oncology

## 2024-01-01 ENCOUNTER — Encounter: Payer: Self-pay | Admitting: Oncology

## 2024-01-01 ENCOUNTER — Inpatient Hospital Stay: Payer: 59

## 2024-01-01 VITALS — BP 125/77 | HR 72 | Temp 96.4°F | Resp 18 | Wt 210.2 lb

## 2024-01-01 DIAGNOSIS — Z87891 Personal history of nicotine dependence: Secondary | ICD-10-CM | POA: Insufficient documentation

## 2024-01-01 DIAGNOSIS — D696 Thrombocytopenia, unspecified: Secondary | ICD-10-CM | POA: Diagnosis present

## 2024-01-01 DIAGNOSIS — D500 Iron deficiency anemia secondary to blood loss (chronic): Secondary | ICD-10-CM

## 2024-01-01 DIAGNOSIS — D509 Iron deficiency anemia, unspecified: Secondary | ICD-10-CM | POA: Diagnosis present

## 2024-01-01 DIAGNOSIS — M1990 Unspecified osteoarthritis, unspecified site: Secondary | ICD-10-CM | POA: Insufficient documentation

## 2024-01-01 LAB — CBC WITH DIFFERENTIAL (CANCER CENTER ONLY)
Abs Immature Granulocytes: 0.04 10*3/uL (ref 0.00–0.07)
Basophils Absolute: 0.1 10*3/uL (ref 0.0–0.1)
Basophils Relative: 1 %
Eosinophils Absolute: 0.1 10*3/uL (ref 0.0–0.5)
Eosinophils Relative: 2 %
HCT: 39.9 % (ref 39.0–52.0)
Hemoglobin: 13.5 g/dL (ref 13.0–17.0)
Immature Granulocytes: 1 %
Lymphocytes Relative: 15 %
Lymphs Abs: 0.9 10*3/uL (ref 0.7–4.0)
MCH: 32.3 pg (ref 26.0–34.0)
MCHC: 33.8 g/dL (ref 30.0–36.0)
MCV: 95.5 fL (ref 80.0–100.0)
Monocytes Absolute: 0.3 10*3/uL (ref 0.1–1.0)
Monocytes Relative: 6 %
Neutro Abs: 4.2 10*3/uL (ref 1.7–7.7)
Neutrophils Relative %: 75 %
Platelet Count: 120 10*3/uL — ABNORMAL LOW (ref 150–400)
RBC: 4.18 MIL/uL — ABNORMAL LOW (ref 4.22–5.81)
RDW: 15.5 % (ref 11.5–15.5)
WBC Count: 5.6 10*3/uL (ref 4.0–10.5)
nRBC: 0 % (ref 0.0–0.2)

## 2024-01-01 LAB — IRON AND TIBC
Iron: 79 ug/dL (ref 45–182)
Saturation Ratios: 18 % (ref 17.9–39.5)
TIBC: 437 ug/dL (ref 250–450)
UIBC: 358 ug/dL

## 2024-01-01 LAB — FERRITIN: Ferritin: 75 ng/mL (ref 24–336)

## 2024-01-01 NOTE — Assessment & Plan Note (Addendum)
 Labs are reviewed and discussed with patient. Mild thrombocytopenia, normal immature platelet fraction, normal LDH; normal folate, normal Vitamin B12, negative hepatitis and HIV, negative flowcytometry and monoclonal gammopathy workup.   I recommend observation.

## 2024-01-01 NOTE — Assessment & Plan Note (Addendum)
 Labs are reviewed and discussed with patient. He tolerated IV Venofer  treatments.  Lab Results  Component Value Date   HGB 13.5 01/01/2024   TIBC 437 01/01/2024   IRONPCTSAT 18 01/01/2024   FERRITIN 75 01/01/2024    Both hemoglobin and iron  panel have normalized.  Colonoscopy in Dec 2024, no malignancy discovered.  Hold off Venofer 

## 2024-01-01 NOTE — Progress Notes (Signed)
 Hematology/Oncology Consult Note [phone] [phone]     REFERRING PROVIDER: Auston Reyes BIRCH, MD  CHIEF COMPLAINTS/REASON FOR VISIT:  thrombocytopenia and anemia  ASSESSMENT & PLAN:   Thrombocytopenia (HCC) Labs are reviewed and discussed with patient. Mild thrombocytopenia, normal immature platelet fraction, normal LDH; normal folate, normal Vitamin B12, negative hepatitis and HIV, negative flowcytometry and monoclonal gammopathy workup.   I recommend observation.   Iron  deficiency anemia Labs are reviewed and discussed with patient. He tolerated IV Venofer  treatments.  Lab Results  Component Value Date   HGB 13.5 01/01/2024   TIBC 437 01/01/2024   IRONPCTSAT 18 01/01/2024   FERRITIN 75 01/01/2024    Both hemoglobin and iron  panel have normalized.  Colonoscopy in Dec 2024, no malignancy discovered.  Hold off Venofer     Orders Placed This Encounter  Procedures   CBC with Differential (Cancer Center Only)    Standing Status:   Future    Expected Date:   06/30/2024    Expiration Date:   12/31/2024   Iron  and TIBC    Standing Status:   Future    Expected Date:   06/30/2024    Expiration Date:   12/31/2024   Ferritin    Standing Status:   Future    Expected Date:   06/30/2024    Expiration Date:   12/31/2024   Immature Platelet Fraction    Standing Status:   Future    Expected Date:   06/30/2024    Expiration Date:   12/31/2024   Follow up in 6 months.  All questions were answered. The patient knows to call the clinic with any problems, questions or concerns.  Zelphia Cap, MD, PhD [facility] Hematology Oncology 01/01/2024   HISTORY OF PRESENTING ILLNESS:  Dennis Davidson is a 48 y.o. 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 His blood work cbc showed a  gradual decrease in hemoglobin and platelet counts over the past few years, despite normal levels of vitamin B12 and folate.   Recently, the patient trialed a prescription NSAID, Voltaren, for about a week, in addition to his regular use of ibuprofen  for back pain. He discontinued the Voltaren shortly before his most recent labs, which showed alarming results. The patient denies any new medications or over-the-counter supplements.  He reports no night sweats, unintentional weight loss, or fever. 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 The patient denies any other physical symptoms apart from joint pain and arthritis.  The patient has a history of multiple joint replacements and has recently started physical therapy for back pain. He has also been involved in a mental health program for frequent utilizers of the ED and nonviolent misdemeanors due to homelessness, mental illness, and co-occurring substance abuse.  The patient consumes alcohol on a very limited basis, mainly light beer, and does not smoke cigarettes. He has no history of GI bleeding.   Denies history hepatitis or HIV infection Denies history of chronic liver disease Denies routine alcohol consumption. Denies dietary restrictions.  Denies herbal medication  INTERVAL HISTORY Dennis Davidson is a 48 y.o. male who has above history reviewed by me today presents for follow up visit for anemia and thrombocytopenia.  S/p IV Venofer  he tolerated well.  Fatigue  has improved and no more dizzy spells.  Dec 2025 colonoscopy - no malignancy, rectal Hyperplastic polyp and 2 colon tubular adenomatous polyps were  removed.   MEDICAL HISTORY:  Past Medical History:  Diagnosis Date   Anxiety    Arthritis    Coronary artery disease    Depression    Dysrhythmia    slight irregularity   Headache    secondary to bulging disc. better since surgery   Hyperlipidemia     Hypertension     SURGICAL HISTORY: Past Surgical History:  Procedure Laterality Date   ANTERIOR CERVICAL DECOMP/DISCECTOMY FUSION N/A 08/05/2018   Procedure: ANTERIOR CERVICAL DECOMPRESSION/DISCECTOMY FUSION 2 LEVELS-C4-5, C5-6;  Surgeon: Bluford Standing, MD;  Location: [facility] ORS;  Service: Neurosurgery;  Laterality: N/A;   CARDIAC CATHETERIZATION Left 09/07/2016   Procedure: Left Heart Cath and Coronary Angiography;  Surgeon: Cara JONETTA Lovelace, MD;  Location: [facility] INVASIVE CV LAB;  Service: Cardiovascular;  Laterality: Left;   JOINT REPLACEMENT     MOUTH SURGERY     teeth pulled. impacted molars   TOTAL HIP ARTHROPLASTY Left 07/01/2019   Procedure: LEFT TOTAL HIP ARTHROPLASTY;  Surgeon: Kathlynn Sharper, MD;  Location: [facility] ORS;  Service: Orthopedics;  Laterality: Left;   TOTAL HIP ARTHROPLASTY Right 08/31/2020   Procedure: TOTAL HIP ARTHROPLASTY ANTERIOR APPROACH;  Surgeon: Kathlynn Sharper, MD;  Location: [facility] ORS;  Service: Orthopedics;  Laterality: Right;    SOCIAL HISTORY: Social History   Socioeconomic History   Marital status: Married    Spouse name: Robyn   Number of children: Not on file   Years of education: Not on file   Highest education level: Not on file  Occupational History   Occupation: emergency planning/management officer  Tobacco Use   Smoking status: Former    Current packs/day: 0.00    Average packs/day: 0.3 packs/day for 8.0 years (2.0 ttl pk-yrs)    Types: Cigarettes    Start date: 2001    Quit date: 12/08/2007    Years since quitting: 16.0   Smokeless tobacco: Never  Vaping Use   Vaping status: Never Used  Substance and Sexual Activity   Alcohol use: Yes    Comment: minimal   Drug use: No   Sexual activity: Not on file  Other Topics Concern   Not on file  Social History Narrative   ** Merged History Encounter **       Social Drivers of Health   Financial Resource Strain: Low Risk  (02/16/2023)   Received from [facility]   Overall Financial Resource Strain  (CARDIA)    Difficulty of Paying Living Expenses: Not hard at all  Food Insecurity: No Food Insecurity (02/16/2023)   Received from [facility]   Hunger Vital Sign    Worried About Running Out of Food in the Last Year: Never true    Ran Out of Food in the Last Year: Never true  Transportation Needs: No Transportation Needs (02/16/2023)   Received from [facility]   PRAPARE - Transportation    In the past 12 months, has lack of transportation kept you from medical appointments or from getting medications?: No    Lack of Transportation (Non-Medical): No  Physical Activity: Not on file  Stress: Not on file  Social Connections: Not on file  Intimate Partner Violence: Not on file    FAMILY HISTORY: Family History  Problem Relation Age of Onset   Diabetes Mother    Diabetes Father  ALLERGIES:  has no known allergies.  MEDICATIONS:  Current Outpatient Medications  Medication Sig Dispense Refill   ALPRAZolam  (XANAX ) 0.5 MG tablet Take 0.5 mg by mouth 3 (three) times daily as needed for anxiety.      atorvastatin  (LIPITOR ) 80 MG tablet Take 1 tablet by mouth daily.     cetirizine (ZYRTEC) 10 MG tablet Take 10 mg by mouth daily as needed for allergies.     escitalopram  (LEXAPRO ) 20 MG tablet Take 20 mg by mouth daily.     lisinopril -hydrochlorothiazide  (PRINZIDE ,ZESTORETIC ) 10-12.5 MG tablet TAKE ONE TABLET EVERY DAY (Patient taking differently: Take 1 tablet by mouth daily.) 90 tablet 3   metoprolol  tartrate (LOPRESSOR ) 25 MG tablet Take 25 mg by mouth 2 (two) times daily.     Multiple Vitamin (MULTIVITAMIN WITH MINERALS) TABS tablet Take 1 tablet by mouth daily.     No current facility-administered medications for this visit.    Review of Systems  Constitutional:  Negative for appetite change, chills, fatigue, fever and unexpected weight change.  HENT:   Negative for hearing loss and voice change.   Eyes:  Negative for eye problems and icterus.   Respiratory:  Negative for chest tightness, cough and shortness of breath.   Cardiovascular:  Negative for chest pain and leg swelling.  Gastrointestinal:  Negative for abdominal distention and abdominal pain.  Endocrine: Negative for hot flashes.  Genitourinary:  Negative for difficulty urinating, dysuria and frequency.   Musculoskeletal:  Positive for arthralgias and back pain.  Skin:  Negative for itching and rash.  Neurological:  Negative for light-headedness and numbness.  Hematological:  Negative for adenopathy. Does not bruise/bleed easily.  Psychiatric/Behavioral:  Negative for confusion.     PHYSICAL EXAMINATION: ECOG PERFORMANCE STATUS: 1 - Symptomatic but completely ambulatory Vitals:   01/01/24 1016  BP: 125/77  Pulse: 72  Resp: 18  Temp: (!) 96.4 F (35.8 C)  SpO2: 100%   Filed Weights   01/01/24 1016  Weight: 210 lb 3.2 oz (95.3 kg)    Physical Exam Constitutional:      General: He is not in acute distress.    Appearance: He is obese.  HENT:     Head: Normocephalic and atraumatic.  Eyes:     General: No scleral icterus. Cardiovascular:     Rate and Rhythm: Normal rate and regular rhythm.     Heart sounds: Normal heart sounds.  Pulmonary:     Effort: Pulmonary effort is normal. No respiratory distress.     Breath sounds: No wheezing.  Abdominal:     General: Bowel sounds are normal. There is no distension.     Palpations: Abdomen is soft.  Musculoskeletal:        General: No deformity. Normal range of motion.     Cervical back: Normal range of motion and neck supple.  Skin:    General: Skin is warm and dry.     Findings: No erythema or rash.  Neurological:     Mental Status: He is alert and oriented to person, place, and time. Mental status is at baseline.     Cranial Nerves: No cranial nerve deficit.  Psychiatric:        Mood and Affect: Mood normal.      LABORATORY DATA:  I have reviewed the data as listed    Latest Ref Rng & Units  01/01/2024   10:08 AM 09/27/2023    4:03 PM 08/31/2020    2:32 PM  CBC  WBC 4.0 -  10.5 K/uL 5.6  5.8  13.4   Hemoglobin 13.0 - 17.0 g/dL 86.4  7.7  89.6   Hematocrit 39.0 - 52.0 % 39.9  25.0  31.0   Platelets 150 - 400 K/uL 120  152  206       Latest Ref Rng & Units 09/27/2023    4:03 PM 08/31/2020    2:32 PM 07/03/2019    8:47 AM  CMP  Glucose 70 - 99 mg/dL 850   849   BUN 6 - 20 mg/dL 11   7   Creatinine 9.38 - 1.24 mg/dL 9.10  9.15  9.26   Sodium 135 - 145 mmol/L 133   131   Potassium 3.5 - 5.1 mmol/L 3.3   4.1   Chloride 98 - 111 mmol/L 98   99   CO2 22 - 32 mmol/L 25   23   Calcium  8.9 - 10.3 mg/dL 9.1   8.4   Total Protein 6.5 - 8.1 g/dL 6.8     Total Bilirubin 0.3 - 1.2 mg/dL 0.6     Alkaline Phos 38 - 126 U/L 63     AST 15 - 41 U/L 45     ALT 0 - 44 U/L 34        RADIOGRAPHIC STUDIES: I have personally reviewed the radiological images as listed and agreed with the findings in the report.  MR BRAIN WO CONTRAST Result Date: 10/29/2023 CLINICAL DATA:  Postural dizziness. EXAM: MRI HEAD WITHOUT CONTRAST TECHNIQUE: Multiplanar, multiecho pulse sequences of the brain and surrounding structures were obtained without intravenous contrast. COMPARISON:  Head MRI 12/07/2015 FINDINGS: Brain: There is no evidence of an acute infarct, intracranial hemorrhage, mass, midline shift, or extra-axial fluid collection. There is new mild generalized cerebral atrophy which is advanced for age. The brain is normal in signal. Vascular: Major intracranial vascular flow voids are preserved. Skull and upper cervical spine: Unremarkable bone marrow signal. Sinuses/Orbits: Unremarkable orbits. Paranasal sinuses and mastoid air cells are clear. Other: None. IMPRESSION: 1. No acute intracranial abnormality. 2. Mild cerebral atrophy. Electronically Signed   By: Dasie Hamburg M.D.   On: 10/29/2023 09:21   MR CERVICAL SPINE WO CONTRAST Result Date: 10/25/2023 CLINICAL DATA:  Initial evaluation for chronic neck  pain with radiation into the left upper extremity, radiculitis and myelopathy. History of prior fusion. EXAM: MRI CERVICAL SPINE WITHOUT CONTRAST TECHNIQUE: Multiplanar, multisequence MR imaging of the cervical spine was performed. No intravenous contrast was administered. COMPARISON:  Previous MRI from 01/05/2018. FINDINGS: Alignment: Straightening with mild reversal of the normal cervical lordosis. Trace degenerative retrolisthesis of C6 on C7. Vertebrae: Sequelae of prior ACDF at C4-C6. There appears to be solid arthrodesis across these levels. Vertebral body height maintained without acute or chronic fracture. Bone marrow signal intensity within normal limits. No worrisome osseous lesions or abnormal marrow edema. Cord: Normal signal and morphology. Posterior Fossa, vertebral arteries, paraspinal tissues: Unremarkable. Disc levels: C2-C3: Mild right-sided uncovertebral spurring without significant disc bulge. No spinal stenosis. Foramina remain patent. C3-C4: Mild right eccentric disc bulge with right greater than left uncovertebral spurring. Flattening of the ventral thecal sac without significant spinal stenosis. Mild right C4 foraminal narrowing. Left neural foramen remains patent. C4-C5: Prior fusion. No residual spinal stenosis. Left greater than right uncovertebral spurring with residual mild to moderate left C5 foraminal stenosis. Right neural foramen remains patent. C5-C6: Prior fusion. No residual spinal stenosis. Left-sided uncovertebral spurring with residual severe left C6 foraminal narrowing (series 113, image 19). C6-C7:  Right eccentric disc osteophyte complex with bilateral uncovertebral spurring. Flattening of the ventral thecal sac with resultant mild spinal stenosis. Severe right with moderate left C7 foraminal stenosis. C7-T1: Negative interspace. Mild right greater than left facet hypertrophy. No significant canal or foraminal stenosis. T1-2: Unremarkable. T2-3: Seen only on sagittal  projection. Degenerative disc bulging mildly flattens the ventral thecal sac. Mild bilateral facet hypertrophy. No significant spinal stenosis. Mild right foraminal narrowing. Left neural foramen appears grossly patent. IMPRESSION: 1. Prior ACDF at C4-C6 without residual or recurrent spinal stenosis. Residual left-sided uncovertebral spurring with residual mild to moderate left C5 and severe left C6 foraminal stenosis. 2. Right eccentric disc osteophyte complex at C6-7 with resultant mild spinal stenosis, with severe right and moderate left C7 foraminal narrowing. 3. Right eccentric disc bulge with uncovertebral spurring at C3-4 with resultant mild right C4 foraminal stenosis. Electronically Signed   By: Morene Hoard M.D.   On: 10/25/2023 18:20   US  AORTA DUPLEX LIMITED Result Date: 10/16/2023 CLINICAL DATA:  Dizziness, aortic calcification, hypertension. EXAM: ULTRASOUND OF ABDOMINAL AORTA TECHNIQUE: Ultrasound examination of the abdominal aorta and proximal common iliac arteries was performed to evaluate for aneurysm. Additional color and Doppler images of the distal aorta were obtained to document patency. COMPARISON:  None Available. FINDINGS: Abdominal aortic measurements as follows: Proximal:  2.4 x 2.4 cm Mid:  2.5 x 2.1 cm Distal:  2.2 x 2.1 cm Patent: Yes, peak systolic velocity is 160 cm/s Right common iliac artery: 1.6 x 1.4 cm Left common iliac artery: 1.4 x 1.1 cm IMPRESSION: No aneurysm. Electronically Signed   By: Fonda Field M.D.   On: 10/16/2023 21:03

## 2024-06-23 ENCOUNTER — Encounter: Payer: Self-pay | Admitting: Oncology

## 2024-06-30 ENCOUNTER — Inpatient Hospital Stay: Payer: 59 | Admitting: Oncology

## 2024-06-30 ENCOUNTER — Other Ambulatory Visit: Payer: 59

## 2024-06-30 ENCOUNTER — Telehealth: Payer: Self-pay | Admitting: Oncology

## 2024-06-30 NOTE — Telephone Encounter (Signed)
 This pateint left a message with answering service that he is out of town and needs to reschedule his appt today for lab/MD at 230 pm . Team was notified.

## 2024-07-30 ENCOUNTER — Inpatient Hospital Stay: Payer: Self-pay | Attending: Internal Medicine

## 2024-07-30 ENCOUNTER — Inpatient Hospital Stay: Payer: Self-pay | Admitting: Oncology

## 2024-07-30 ENCOUNTER — Encounter: Payer: Self-pay | Admitting: Oncology

## 2024-09-24 ENCOUNTER — Encounter: Payer: Self-pay | Admitting: Internal Medicine

## 2024-09-24 ENCOUNTER — Observation Stay: Admit: 2024-09-24 | Payer: Self-pay

## 2024-09-24 ENCOUNTER — Other Ambulatory Visit: Payer: Self-pay

## 2024-09-24 ENCOUNTER — Encounter: Payer: Self-pay | Admitting: Oncology

## 2024-09-24 ENCOUNTER — Observation Stay
Admission: EM | Admit: 2024-09-24 | Discharge: 2024-09-25 | Disposition: A | Discharge status: Home / Self Care | Payer: Self-pay | Attending: Osteopathic Medicine | Admitting: Osteopathic Medicine

## 2024-09-24 ENCOUNTER — Observation Stay: Admit: 2024-09-24 | Discharge: 2024-09-24 | Disposition: A | Payer: Self-pay | Attending: Internal Medicine

## 2024-09-24 ENCOUNTER — Emergency Department: Payer: Self-pay

## 2024-09-24 DIAGNOSIS — Z96643 Presence of artificial hip joint, bilateral: Secondary | ICD-10-CM | POA: Insufficient documentation

## 2024-09-24 DIAGNOSIS — F1010 Alcohol abuse, uncomplicated: Secondary | ICD-10-CM | POA: Insufficient documentation

## 2024-09-24 DIAGNOSIS — W19XXXA Unspecified fall, initial encounter: Secondary | ICD-10-CM | POA: Insufficient documentation

## 2024-09-24 DIAGNOSIS — R55 Syncope and collapse: Secondary | ICD-10-CM

## 2024-09-24 DIAGNOSIS — Z87891 Personal history of nicotine dependence: Secondary | ICD-10-CM | POA: Insufficient documentation

## 2024-09-24 DIAGNOSIS — E119 Type 2 diabetes mellitus without complications: Secondary | ICD-10-CM | POA: Insufficient documentation

## 2024-09-24 DIAGNOSIS — E785 Hyperlipidemia, unspecified: Secondary | ICD-10-CM | POA: Insufficient documentation

## 2024-09-24 DIAGNOSIS — Z79899 Other long term (current) drug therapy: Secondary | ICD-10-CM | POA: Insufficient documentation

## 2024-09-24 DIAGNOSIS — S0990XA Unspecified injury of head, initial encounter: Principal | ICD-10-CM

## 2024-09-24 DIAGNOSIS — Z7984 Long term (current) use of oral hypoglycemic drugs: Secondary | ICD-10-CM | POA: Insufficient documentation

## 2024-09-24 DIAGNOSIS — S0101XA Laceration without foreign body of scalp, initial encounter: Secondary | ICD-10-CM | POA: Insufficient documentation

## 2024-09-24 DIAGNOSIS — I2510 Atherosclerotic heart disease of native coronary artery without angina pectoris: Secondary | ICD-10-CM | POA: Insufficient documentation

## 2024-09-24 DIAGNOSIS — I10 Essential (primary) hypertension: Secondary | ICD-10-CM | POA: Insufficient documentation

## 2024-09-24 DIAGNOSIS — Z6830 Body mass index (BMI) 30.0-30.9, adult: Secondary | ICD-10-CM | POA: Insufficient documentation

## 2024-09-24 DIAGNOSIS — D509 Iron deficiency anemia, unspecified: Secondary | ICD-10-CM | POA: Insufficient documentation

## 2024-09-24 DIAGNOSIS — I951 Orthostatic hypotension: Secondary | ICD-10-CM

## 2024-09-24 DIAGNOSIS — E66811 Obesity, class 1: Secondary | ICD-10-CM | POA: Insufficient documentation

## 2024-09-24 DIAGNOSIS — R42 Dizziness and giddiness: Secondary | ICD-10-CM | POA: Insufficient documentation

## 2024-09-24 LAB — URINE DRUG SCREEN, QUALITATIVE (ARMC ONLY)
Amphetamines, Ur Screen: NOT DETECTED
Barbiturates, Ur Screen: NOT DETECTED
Benzodiazepine, Ur Scrn: POSITIVE — AB
Cannabinoid 50 Ng, Ur ~~LOC~~: NOT DETECTED
Cocaine Metabolite,Ur ~~LOC~~: NOT DETECTED
MDMA (Ecstasy)Ur Screen: NOT DETECTED
Methadone Scn, Ur: NOT DETECTED
Opiate, Ur Screen: NOT DETECTED
Phencyclidine (PCP) Ur S: NOT DETECTED
Tricyclic, Ur Screen: NOT DETECTED

## 2024-09-24 LAB — ECHOCARDIOGRAM COMPLETE
AR max vel: 1.6 cm2
AV Area VTI: 1.68 cm2
AV Area mean vel: 1.6 cm2
AV Mean grad: 7 mmHg
AV Peak grad: 12.3 mmHg
Ao pk vel: 1.75 m/s
Area-P 1/2: 3.77 cm2
Calc EF: 81.5 %
Height: 67 in
MV VTI: 1.79 cm2
S' Lateral: 1.8 cm
Single Plane A2C EF: 85.1 %
Single Plane A4C EF: 77.1 %
Weight: 3312 [oz_av]

## 2024-09-24 LAB — CBC WITH DIFFERENTIAL/PLATELET
Abs Immature Granulocytes: 0.03 K/uL (ref 0.00–0.07)
Basophils Absolute: 0 K/uL (ref 0.0–0.1)
Basophils Relative: 1 %
Eosinophils Absolute: 0.1 K/uL (ref 0.0–0.5)
Eosinophils Relative: 2 %
HCT: 27.7 % — ABNORMAL LOW (ref 39.0–52.0)
Hemoglobin: 8.9 g/dL — ABNORMAL LOW (ref 13.0–17.0)
Immature Granulocytes: 1 %
Lymphocytes Relative: 14 %
Lymphs Abs: 0.9 K/uL (ref 0.7–4.0)
MCH: 30 pg (ref 26.0–34.0)
MCHC: 32.1 g/dL (ref 30.0–36.0)
MCV: 93.3 fL (ref 80.0–100.0)
Monocytes Absolute: 0.5 K/uL (ref 0.1–1.0)
Monocytes Relative: 7 %
Neutro Abs: 5 K/uL (ref 1.7–7.7)
Neutrophils Relative %: 75 %
Platelets: 106 K/uL — ABNORMAL LOW (ref 150–400)
RBC: 2.97 MIL/uL — ABNORMAL LOW (ref 4.22–5.81)
RDW: 14.7 % (ref 11.5–15.5)
WBC: 6.5 K/uL (ref 4.0–10.5)
nRBC: 0 % (ref 0.0–0.2)

## 2024-09-24 LAB — TYPE AND SCREEN
ABO/RH(D): A POS
Antibody Screen: NEGATIVE

## 2024-09-24 LAB — MAGNESIUM: Magnesium: 1.2 mg/dL — ABNORMAL LOW (ref 1.7–2.4)

## 2024-09-24 LAB — URINALYSIS, ROUTINE W REFLEX MICROSCOPIC
Bilirubin Urine: NEGATIVE
Glucose, UA: NEGATIVE mg/dL
Hgb urine dipstick: NEGATIVE
Ketones, ur: NEGATIVE mg/dL
Leukocytes,Ua: NEGATIVE
Nitrite: NEGATIVE
Protein, ur: NEGATIVE mg/dL
Specific Gravity, Urine: 1.004 — ABNORMAL LOW (ref 1.005–1.030)
pH: 6 (ref 5.0–8.0)

## 2024-09-24 LAB — COMPREHENSIVE METABOLIC PANEL WITH GFR
ALT: 32 U/L (ref 0–44)
AST: 41 U/L (ref 15–41)
Albumin: 3.8 g/dL (ref 3.5–5.0)
Alkaline Phosphatase: 52 U/L (ref 38–126)
Anion gap: 17 — ABNORMAL HIGH (ref 5–15)
BUN: 10 mg/dL (ref 6–20)
CO2: 24 mmol/L (ref 22–32)
Calcium: 9.5 mg/dL (ref 8.9–10.3)
Chloride: 92 mmol/L — ABNORMAL LOW (ref 98–111)
Creatinine, Ser: 0.87 mg/dL (ref 0.61–1.24)
GFR, Estimated: >60 mL/min (ref >60)
Glucose, Bld: 148 mg/dL — ABNORMAL HIGH (ref 70–99)
Potassium: 3 mmol/L — ABNORMAL LOW (ref 3.5–5.1)
Sodium: 133 mmol/L — ABNORMAL LOW (ref 135–145)
Total Bilirubin: 0.8 mg/dL (ref 0.0–1.2)
Total Protein: 7.5 g/dL (ref 6.5–8.1)

## 2024-09-24 LAB — TROPONIN I (HIGH SENSITIVITY): Troponin I (High Sensitivity): 4 ng/L (ref <18)

## 2024-09-24 LAB — GLUCOSE, CAPILLARY
Glucose-Capillary: 118 mg/dL — ABNORMAL HIGH (ref 70–99)
Glucose-Capillary: 103 mg/dL — ABNORMAL HIGH (ref 70–99)
Glucose-Capillary: 94 mg/dL (ref 70–99)

## 2024-09-24 LAB — TSH: TSH: 4.332 u[IU]/mL (ref 0.350–4.500)

## 2024-09-24 LAB — CBG MONITORING, ED: Glucose-Capillary: 147 mg/dL — ABNORMAL HIGH (ref 70–99)

## 2024-09-24 LAB — ETHANOL: Alcohol, Ethyl (B): 24 mg/dL — ABNORMAL HIGH (ref <15)

## 2024-09-24 LAB — HEMOGLOBIN A1C
Hgb A1c MFr Bld: 6.9 % — ABNORMAL HIGH (ref 4.8–5.6)
Mean Plasma Glucose: 151.33 mg/dL

## 2024-09-24 LAB — CORTISOL: Cortisol, Plasma: 22.5 ug/dL

## 2024-09-24 MED ORDER — KETOROLAC TROMETHAMINE 30 MG/ML IJ SOLN
30.0000 mg | Freq: Once | INTRAMUSCULAR | Status: AC
  Administered 2024-09-24: 30 mg via INTRAVENOUS
  Filled 2024-09-24: qty 1

## 2024-09-24 MED ORDER — ONDANSETRON HCL 4 MG/2ML IJ SOLN: 4.0000 mg | Freq: Four times a day (QID) | INTRAMUSCULAR | Status: DC | PRN

## 2024-09-24 MED ORDER — ALPRAZOLAM 0.5 MG PO TABS
0.5000 mg | ORAL_TABLET | Freq: Three times a day (TID) | ORAL | Status: DC | PRN
  Administered 2024-09-24: 0.5 mg via ORAL
  Filled 2024-09-24: qty 1

## 2024-09-24 MED ORDER — LOPERAMIDE HCL 2 MG PO CAPS
2.0000 mg | ORAL_CAPSULE | ORAL | Status: DC | PRN
Start: 2024-09-24 — End: 2024-09-25
  Administered 2024-09-24: 2 mg via ORAL
  Filled 2024-09-24: qty 1

## 2024-09-24 MED ORDER — SODIUM CHLORIDE 0.9 % IV BOLUS (SEPSIS): 1000.0000 mL | Freq: Once | INTRAVENOUS | Status: DC

## 2024-09-24 MED ORDER — BUTALBITAL-APAP-CAFFEINE 50-325-40 MG PO TABS
1.0000 | ORAL_TABLET | Freq: Four times a day (QID) | ORAL | Status: DC | PRN
  Administered 2024-09-24 – 2024-09-25 (×4): 1 via ORAL
  Filled 2024-09-24 (×4): qty 1

## 2024-09-24 MED ORDER — SODIUM CHLORIDE 0.9% FLUSH
3.0000 mL | Freq: Two times a day (BID) | INTRAVENOUS | Status: DC
  Administered 2024-09-24 – 2024-09-25 (×3): 3 mL via INTRAVENOUS

## 2024-09-24 MED ORDER — LACTATED RINGERS IV BOLUS
1000.0000 mL | Freq: Once | INTRAVENOUS | Status: AC
  Administered 2024-09-24: 1000 mL via INTRAVENOUS

## 2024-09-24 MED ORDER — ENOXAPARIN SODIUM 60 MG/0.6ML IJ SOSY
0.5000 mg/kg | PREFILLED_SYRINGE | INTRAMUSCULAR | Status: DC
  Administered 2024-09-24: 47.5 mg via SUBCUTANEOUS
  Filled 2024-09-24: qty 0.6

## 2024-09-24 MED ORDER — SODIUM CHLORIDE 0.9 % IV SOLN: INTRAVENOUS | Status: DC

## 2024-09-24 MED ORDER — ESCITALOPRAM OXALATE 10 MG PO TABS
20.0000 mg | ORAL_TABLET | Freq: Every day | ORAL | Status: DC
Start: 2024-09-24 — End: 2024-09-25
  Administered 2024-09-24 – 2024-09-25 (×2): 20 mg via ORAL
  Filled 2024-09-24 (×2): qty 2

## 2024-09-24 MED ORDER — SODIUM CHLORIDE 0.9 % IV BOLUS (SEPSIS)
1000.0000 mL | Freq: Once | INTRAVENOUS | Status: AC
  Administered 2024-09-24: 1000 mL via INTRAVENOUS

## 2024-09-24 MED ORDER — METFORMIN HCL 500 MG PO TABS
500.0000 mg | ORAL_TABLET | Freq: Two times a day (BID) | ORAL | Status: DC
Start: 2024-09-24 — End: 2024-09-25
  Administered 2024-09-24 – 2024-09-25 (×3): 500 mg via ORAL
  Filled 2024-09-24 (×3): qty 1

## 2024-09-24 MED ORDER — ONDANSETRON HCL 4 MG PO TABS: 4.0000 mg | ORAL_TABLET | Freq: Four times a day (QID) | ORAL | Status: DC | PRN

## 2024-09-24 MED ORDER — LIDOCAINE HCL (PF) 1 % IJ SOLN
5.0000 mL | Freq: Once | INTRAMUSCULAR | Status: AC
  Administered 2024-09-24: 5 mL via INTRADERMAL
  Filled 2024-09-24: qty 5

## 2024-09-24 MED ORDER — MIDODRINE HCL 5 MG PO TABS
10.0000 mg | ORAL_TABLET | Freq: Once | ORAL | Status: AC
  Administered 2024-09-24: 10 mg via ORAL
  Filled 2024-09-24: qty 2

## 2024-09-24 MED ORDER — GABAPENTIN 300 MG PO CAPS
300.0000 mg | ORAL_CAPSULE | Freq: Every day | ORAL | Status: DC
  Administered 2024-09-24: 300 mg via ORAL
  Filled 2024-09-24: qty 1

## 2024-09-24 MED ORDER — PERFLUTREN LIPID MICROSPHERE
1.0000 mL | INTRAVENOUS | Status: AC | PRN
  Administered 2024-09-24: 2 mL via INTRAVENOUS

## 2024-09-24 MED ORDER — POTASSIUM CHLORIDE 20 MEQ PO PACK
60.0000 meq | PACK | Freq: Once | ORAL | Status: AC
  Administered 2024-09-24: 60 meq via ORAL
  Filled 2024-09-24: qty 3

## 2024-09-24 MED ORDER — BACITRACIN ZINC 500 UNIT/GM EX OINT
TOPICAL_OINTMENT | Freq: Once | CUTANEOUS | Status: AC
Start: 2024-09-24 — End: 2024-09-24
  Administered 2024-09-24: 1 via TOPICAL
  Filled 2024-09-24: qty 0.9

## 2024-09-24 MED ORDER — INSULIN ASPART 100 UNIT/ML IJ SOLN: 0.0000 [IU] | Freq: Three times a day (TID) | INTRAMUSCULAR | Status: DC

## 2024-09-24 MED ORDER — ENOXAPARIN SODIUM 40 MG/0.4ML IJ SOSY: 40.0000 mg | PREFILLED_SYRINGE | INTRAMUSCULAR | Status: DC

## 2024-09-24 NOTE — ED Notes (Signed)
 Called [facility] to initiate cardiac monitoring.

## 2024-09-24 NOTE — Progress Notes (Signed)
 PHARMACIST - PHYSICIAN COMMUNICATION  CONCERNING:  Enoxaparin  (Lovenox ) for DVT Prophylaxis    RECOMMENDATION: Patient was prescribed enoxaprin 40mg  q24 hours for VTE prophylaxis.   Filed Weights   09/24/24 0522  Weight: 93.9 kg (207 lb)    Body mass index is 32.42 kg/m.  Estimated Creatinine Clearance: 115.8 mL/min (by C-G formula based on SCr of 0.87 mg/dL).   Based on [facility] policy patient is candidate for enoxaparin  0.5mg /kg TBW SQ every 24 hours based on BMI being >30.  DESCRIPTION: Pharmacy has adjusted enoxaparin  dose per [facility] policy.  Patient is now receiving enoxaparin  47.5 mg every 24 hours    Lum VEAR Mania, PharmD Clinical Pharmacist  09/24/2024 9:10 AM

## 2024-09-24 NOTE — ED Triage Notes (Signed)
 BIBEMS from home. Pt fell out of bed, about a 2 in laceration noted to head. Pt complains of lightheaded. Pt denies LOC, not on blood thinners. 106/65, otherwise VSS. 201 CBG, hx of diabetes.

## 2024-09-24 NOTE — Discharge Instructions (Signed)
 You may alternate over the counter Tylenol  1000 mg every 6 hours as needed for pain, fever and Ibuprofen 800 mg every 6-8 hours as needed for pain, fever.  Please take Ibuprofen with food.  Do not take more than 4000 mg of Tylenol  (acetaminophen ) in a 24 hour period.  You may shower, bathe normally but do not submerge your head underwater until your wound has completely healed.  You will need to have your 6 staples removed in 7 to 10 days.  You may keep this area uncovered or cover with a large bandage if desired.

## 2024-09-24 NOTE — ED Notes (Signed)
 Orthostatic vital signs completed. Upon sitting from lying pt c/o being lightheaded and dizzy. Pt BP went from 106/67 laying to 83/57 sitting. Provider notified and pt assisted back to laying in bed.

## 2024-09-24 NOTE — ED Provider Notes (Signed)
 [facility] Provider Note    Event Date/Time   First MD Initiated Contact with Patient 09/24/24 [phone]     (approximate)   History   Fall   HPI  Dennis Davidson is a 48 y.o. male with history of iron  deficiency anemia, CAD, hypertension, hyperlipidemia who presents to the emergency department after he fell.  States he was sitting on the edge of his bed because he could not get comfortable while sleeping and felt dizzy and slid off the bed and hit his head.  He is not on blood thinners.  Has a laceration to the posterior right scalp.  Reports chronic neck pain after prior neck fusion and has intermittent numbness intermittently in his left hand.  Denies chest pain, shortness of breath, vomiting, diarrhea.  Denies other injury.  States his tetanus vaccine is up-to-date.  Reports drinking several beers tonight.  Denies drinking alcohol every day.  No drug use.   History provided by patient, EMS.    Past Medical History:  Diagnosis Date   Anxiety    Arthritis    Coronary artery disease    Depression    Dysrhythmia    slight irregularity   Headache    secondary to bulging disc. better since surgery   Hyperlipidemia    Hypertension     Past Surgical History:  Procedure Laterality Date   ANTERIOR CERVICAL DECOMP/DISCECTOMY FUSION N/A 08/05/2018   Procedure: ANTERIOR CERVICAL DECOMPRESSION/DISCECTOMY FUSION 2 LEVELS-C4-5, C5-6;  Surgeon: Bluford Standing, MD;  Location: [facility] ORS;  Service: Neurosurgery;  Laterality: N/A;   CARDIAC CATHETERIZATION Left 09/07/2016   Procedure: Left Heart Cath and Coronary Angiography;  Surgeon: Cara JONETTA Lovelace, MD;  Location: [facility] IN[facility];  Service: Cardiovascular;  Laterality: Left;   JOINT REPLACEMENT     MOUTH SURGERY     teeth pulled. impacted molars   TOTAL HIP ARTHROPLASTY Left 07/01/2019   Procedure: LEFT TOTAL HIP ARTHROPLASTY;  Surgeon: Kathlynn Sharper, MD;  Location: [facility] ORS;  Service: Orthopedics;  Laterality:  Left;   TOTAL HIP ARTHROPLASTY Right 08/31/2020   Procedure: TOTAL HIP ARTHROPLASTY ANTERIOR APPROACH;  Surgeon: Kathlynn Sharper, MD;  Location: [facility] ORS;  Service: Orthopedics;  Laterality: Right;    MEDICATIONS:  Prior to Admission medications   Medication Sig Start Date End Date Taking? Authorizing Provider  ALPRAZolam  (XANAX ) 0.5 MG tablet Take 0.5 mg by mouth 3 (three) times daily as needed for anxiety.     [provider]  atorvastatin  (LIPITOR ) 80 MG tablet Take 1 tablet by mouth daily. 07/05/21   [provider]  cetirizine (ZYRTEC) 10 MG tablet Take 10 mg by mouth daily as needed for allergies.    [provider]  escitalopram  (LEXAPRO ) 20 MG tablet Take 20 mg by mouth daily. 11/13/16   [provider]  lisinopril -hydrochlorothiazide  (PRINZIDE ,ZESTORETIC ) 10-12.5 MG tablet TAKE ONE TABLET EVERY DAY Patient taking differently: Take 1 tablet by mouth daily. 08/20/17   Gasper Devere ORN, PA-C  metoprolol  tartrate (LOPRESSOR ) 25 MG tablet Take 25 mg by mouth 2 (two) times daily.    [provider]  Multiple Vitamin (MULTIVITAMIN WITH MINERALS) TABS tablet Take 1 tablet by mouth daily.    [provider]    Physical Exam   Triage Vital Signs: ED Triage Vitals  Encounter Vitals Group     BP --      Girls Systolic BP Percentile --      Girls Diastolic BP Percentile --  Boys Systolic BP Percentile --      Boys Diastolic BP Percentile --      Pulse --      Resp --      Temp --      Temp src --      SpO2 --      Weight 09/24/24 0522 207 lb (93.9 kg)     Height 09/24/24 0522 5' 7 (1.702 m)     Head Circumference --      Peak Flow --      Pain Score 09/24/24 0521 8     Pain Loc --      Pain Education --      Exclude from Growth Chart --     Most recent vital signs: Vitals:   09/24/24 0645 09/24/24 0700  BP: 99/62 100/68  Pulse: 62 65  Resp: 17 18  Temp:    SpO2: 100% 100%     CONSTITUTIONAL: Alert, responds  appropriately to questions. Well-appearing; well-nourished; GCS 15 HEAD: Normocephalic; 4 cm superficial laceration to the posterior right scalp EYES: Conjunctivae clear, PERRL, EOMI ENT: normal nose; no rhinorrhea; moist mucous membranes; pharynx without lesions noted; no dental injury; no septal hematoma, no epistaxis; no facial deformity or bony tenderness NECK: Supple, no midline spinal tenderness, step-off or deformity; trachea midline CARD: RRR; S1 and S2 appreciated; no murmurs, no clicks, no rubs, no gallops RESP: Normal chest excursion without splinting or tachypnea; breath sounds clear and equal bilaterally; no wheezes, no rhonchi, no rales; no hypoxia or respiratory distress CHEST:  chest wall stable, no crepitus or ecchymosis or deformity, nontender to palpation; no flail chest ABD/GI: Non-distended; soft, non-tender, no rebound, no guarding; no ecchymosis or other lesions noted PELVIS:  stable, nontender to palpation BACK:  The back appears normal; no midline spinal tenderness, step-off or deformity EXT: Normal ROM in all joints; no edema; normal capillary refill; no cyanosis, no bony tenderness or bony deformity of patient's extremities, no joint effusions, compartments are soft, extremities are warm and well-perfused, no ecchymosis SKIN: Normal color for age and race; warm NEURO: No facial asymmetry, normal speech, moving all extremities equally  ED Results / Procedures / Treatments   LABS: (all labs ordered are listed, but only abnormal results are displayed) Labs Reviewed  CBC WITH DIFFERENTIAL/PLATELET - Abnormal; Notable for the following components:      Result Value   RBC 2.97 (*)    Hemoglobin 8.9 (*)    HCT 27.7 (*)    Platelets 106 (*)    All other components within normal limits  COMPREHENSIVE METABOLIC PANEL WITH GFR - Abnormal; Notable for the following components:   Sodium 133 (*)    Potassium 3.0 (*)    Chloride 92 (*)    Glucose, Bld 148 (*)    Anion gap  17 (*)    All other components within normal limits  ETHANOL - Abnormal; Notable for the following components:   Alcohol, Ethyl (B) 24 (*)    All other components within normal limits  URINALYSIS, ROUTINE W REFLEX MICROSCOPIC - Abnormal; Notable for the following components:   Color, Urine YELLOW (*)    APPearance CLEAR (*)    Specific Gravity, Urine 1.004 (*)    All other components within normal limits  CBG MONITORING, ED - Abnormal; Notable for the following components:   Glucose-Capillary 147 (*)    All other components within normal limits  TYPE AND SCREEN  TROPONIN I (HIGH SENSITIVITY)  EKG:  EKG Interpretation Date/Time:  Wednesday September 24 2024 05:25:20 EDT Ventricular Rate:  72 PR Interval:  163 QRS Duration:  100 QT Interval:  488 QTC Calculation: 535 R Axis:   63  Text Interpretation: Normal sinus rhythm Artifact Confirmed by Neomi Neptune [phone]) on 09/24/2024 5:27:08 AM          RADIOLOGY: My personal review and interpretation of imaging: CTs show no acute traumatic injury.  I have personally reviewed all radiology reports. CT Cervical Spine Wo Contrast Result Date: 09/24/2024 EXAM: CT CERVICAL SPINE WITHOUT CONTRAST 09/24/2024 06:00:24 AM TECHNIQUE: CT of the cervical spine was performed without the administration of intravenous contrast. Multiplanar reformatted images are provided for review. Automated exposure control, iterative reconstruction, and/or weight based adjustment of the mA/kV was utilized to reduce the radiation dose to as low as reasonably achievable. COMPARISON: None available. CLINICAL HISTORY: Headache, neck pain, fall, intoxication. Pt fell out of bed, about a 2 in laceration noted to head. Pt complains of lightheaded. Pt denies LOC, not on blood thinners. FINDINGS: CERVICAL SPINE: BONES AND ALIGNMENT: There is straightening of the normal cervical lordosis. The patient is status post ACAF at C4-C5 and C5-C6. There is no evidence of acute  traumatic injury. DEGENERATIVE CHANGES: There is uncovertebral joint hypertrophy present on the left at C5-C6, causing moderate left neural foraminal stenosis. There is chronic degenerative disc disease at C6-C7 with mild central spinal canal stenosis and bilateral neural foraminal stenosis. SOFT TISSUES: No prevertebral soft tissue swelling. There are calcifications within the carotid bulbs bilaterally. IMPRESSION: 1. No acute traumatic injury. 2. Status post ACDF at C4-5 and C5-6. 3. Uncovertebral joint hypertrophy at C5-6 causing moderate left neural foraminal stenosis. 4. Chronic degenerative disc disease at C6-7 with mild central spinal canal stenosis and bilateral neural foraminal stenosis. 5. Calcified atherosclerosis of the carotid bulbs bilaterally. Consider correlation with cardiovascular risk factors and outpatient carotid ultrasound if clinically indicated. Electronically signed by: Evalene Coho MD 09/24/2024 06:06 AM EDT RP Workstation: [id number]   CT HEAD WO CONTRAST ( ) Result Date: 09/24/2024 EXAM: CT HEAD WITHOUT CONTRAST 09/24/2024 06:00:24 AM TECHNIQUE: CT of the head was performed without the administration of intravenous contrast. Automated exposure control, iterative reconstruction, and/or weight based adjustment of the mA/kV was utilized to reduce the radiation dose to as low as reasonably achievable. COMPARISON: CT of the head dated 12/07/2005. CLINICAL HISTORY: Headache, neck pain, fall, intoxication. Pt fell out of bed, about a 2 in laceration noted to head. Pt complains of lightheaded. Pt denies LOC, not on blood thinners. FINDINGS: BRAIN AND VENTRICLES: No acute hemorrhage. No evidence of acute infarct. No hydrocephalus. No extra-axial collection. No mass effect or midline shift. ORBITS: No acute abnormality. SINUSES: Mild mucosal thickening within left maxillary sinus. SOFT TISSUES AND SKULL: No acute soft tissue abnormality. No skull fracture. IMPRESSION: 1. No acute  intracranial abnormality. 2. Mild mucosal thickening within the left maxillary sinus. Electronically signed by: Evalene Coho MD 09/24/2024 06:03 AM EDT RP Workstation: [id number]     PROCEDURES:  Critical Care performed: Yes, see critical care procedure note(s)   CRITICAL CARE Performed by: Neptune Neomi   Total critical care time: 30 minutes  Critical care time was exclusive of separately billable procedures and treating other patients.  Critical care was necessary to treat or prevent imminent or life-threatening deterioration.  Critical care was time spent personally by me on the following activities: development of treatment plan with patient and/or surrogate as well as nursing, discussions with consultants,  evaluation of patient's response to treatment, examination of patient, obtaining history from patient or surrogate, ordering and performing treatments and interventions, ordering and review of laboratory studies, ordering and review of radiographic studies, pulse oximetry and re-evaluation of patient's condition.   SABRA1-3 Lead EKG Interpretation  Performed by: Melquiades Kovar, Josette SAILOR, DO Authorized by: Danijah Noh, Josette SAILOR, DO     Interpretation: normal     ECG rate:  75   ECG rate assessment: normal     Rhythm: sinus rhythm     Ectopy: none     Conduction: normal     LACERATION REPAIR Performed by: Josette Sink Authorized by: Josette Sink Consent: Verbal consent obtained. Risks and benefits: risks, benefits and alternatives were discussed Consent given by: patient Patient identity confirmed: provided demographic data Prepped and Draped in normal sterile fashion Wound explored  Laceration Location: Right posterior scalp  Laceration Length: 4cm  No Foreign Bodies seen or palpated  Anesthesia: local infiltration  Local anesthetic: lidocaine  1% without epinephrine   Anesthetic total: 5 ml  Irrigation method: syringe Amount of cleaning: standard  Skin closure:  Superficial  Number of sutures: 6 staples  Technique: Area anesthetized using lidocaine  1% without epinephrine . Wound irrigated copiously with sterile saline. Wound then cleaned with alcohol swabs.  Wound closed using 6 staples.  Bacitracin  applied. Good wound approximation and hemostasis achieved.    Patient tolerance: Patient tolerated the procedure well with no immediate complications.   IMPRESSION / MDM / ASSESSMENT AND PLAN / ED COURSE  I reviewed the triage vital signs and the nursing notes.  Patient here after episode of dizziness causing him to fall out of bed and hit his head.  Laceration to the posterior scalp.  Has been drinking alcohol.  The patient is on the cardiac monitor to evaluate for evidence of arrhythmia and/or significant heart rate changes.   DIFFERENTIAL DIAGNOSIS (includes but not limited to):   Dehydration, alcohol intoxication, scalp laceration, intracranial hemorrhage, skull fracture, concussion, anemia, electrolyte derangement, cardiac arrhythmia, ACS, doubt PE or dissection, doubt stroke  Patient's presentation is most consistent with acute presentation with potential threat to life or bodily function.  PLAN: Will obtain labs, urine, CT of the head and cervical spine.  States his tetanus vaccine is up-to-date.  Will provide with pain medication here, IV fluids.  Will repair the laceration to his scalp.   MEDICATIONS GIVEN IN ED: Medications  sodium chloride  0.9 % bolus 1,000 mL (has no administration in time range)  0.9 %  sodium chloride  infusion (has no administration in time range)  sodium chloride  0.9 % bolus 1,000 mL (0 mLs Intravenous Stopped 09/24/24 0719)  lidocaine  (PF) (XYLOCAINE ) 1 % injection 5 mL (5 mLs Intradermal Given 09/24/24 0604)  bacitracin  ointment (1 Application Topical Given 09/24/24 0605)  ketorolac  (TORADOL ) 30 MG/ML injection 30 mg (30 mg Intravenous Given 09/24/24 0604)  potassium chloride  (KLOR-CON ) packet 60 mEq (60 mEq Oral  Given 09/24/24 0648)  midodrine (PROAMATINE) tablet 10 mg (10 mg Oral Given 09/24/24 0720)     ED COURSE: Laceration repair without difficulty.  Pain improved with laceration repair, Toradol .  CTs of the head and cervical spine show no acute traumatic injury when reviewed and interpreted by myself and the radiologist.  Hemoglobin is 8.9 which is downtrending from February 2025.  He has a documented history of iron  deficiency anemia and is followed by hematology.  He denies any bloody stools or melena.  Normal blood glucose.  Normal electrolytes.  Negative troponin.  He  denies any chest pain or shortness of breath.  No indication for serial enzymes.  He is quite orthostatic here.  This could be secondary to drinking alcohol today as his alcohol level is still 24.  Will hydrate and reassess.   7:17 AM  Pt's systolic blood pressure still 99 after a liter of fluids.  Will give second liter, dose of midodrine and allow him to eat and drink.  He is on medication for hypertension but denies that this has been changed recently.  It appears that he takes lisinopril , hydrochlorothiazide  and metoprolol .  I think he will need admission for continued hydration and trending of his hemoglobins.  He reports he has had negative colonoscopy before.  He states he has never had to have a blood transfusion but has had iron  transfusions.  CONSULTS:   Consulted and discussed patient's case with hospitalist.  I have recommended admission and consulting physician agrees and will place admission orders.  Patient (and family if present) agree with this plan.   I reviewed all nursing notes, vitals, pertinent previous records.  All labs, EKGs, imaging ordered have been independently reviewed and interpreted by myself.    OUTSIDE RECORDS REVIEWED:  Reviewed last internal medicine note in April 2025.       FINAL CLINICAL IMPRESSION(S) / ED DIAGNOSES   Final diagnoses:  Injury of head, initial encounter  Scalp  laceration, initial encounter  Dizziness  Orthostatic hypotension  Iron  deficiency anemia, unspecified iron  deficiency anemia type     Rx / DC Orders   ED Discharge Orders     None        Note:  This document was prepared using Dragon voice recognition software and may include unintentional dictation errors.   Kenadee Gates, Josette SAILOR, DO 09/24/24 [phone]

## 2024-09-24 NOTE — ED Notes (Signed)
 Pt given water to drink.

## 2024-09-24 NOTE — H&P (Signed)
 History and Physical    Dennis JU FMW:[id number] DOB: 02/16/1976 DOA: 09/24/2024  PCP: Auston Reyes BIRCH, MD (Confirm with patient/family/NH records and if not entered, this has to be entered at [facility] point of entry) Patient coming from: Home  I have personally briefly reviewed patient's old medical records in [facility]  Chief Complaint: I fell off bed and hit my head  HPI: Dennis Davidson is a 48 y.o. male with medical history significant of alcohol abuse, HTN, HLD, CAD, presented with near syncope and fall.  Patient came back home yesterday evening from his job and he drank 4 beers and went to bed, this morning, he rolled out of bed, and hit my head he denied any headache, no loss control of urine or bowel movement, no LOC.  He did have some feeling of lightheadedness and nausea during the episode but denied any vomiting.  Denied any abdominal pain no diarrhea.  EMS found blood pressure 106/65 glucose 201.  ED Course: Afebrile, blood pressure on the lower side 105/59 O2/and 100% on room air.  Blood work showed K3.0 BUN 10 creatinine 0.8 hemoglobin 8.9 compared to baseline 7.7-10.0, WBC 6.5.  UA negative for UTI.  CT head and neck negative for acute intracranial findings or fracture or dislocation.  Patient was given multiple as needed medication including Toradol , Zofran  in the ED.  Review of Systems: As per HPI otherwise 14 point review of systems negative.    Past Medical History:  Diagnosis Date   Anxiety    Arthritis    Coronary artery disease    Depression    Dysrhythmia    slight irregularity   Headache    secondary to bulging disc. better since surgery   Hyperlipidemia    Hypertension     Past Surgical History:  Procedure Laterality Date   ANTERIOR CERVICAL DECOMP/DISCECTOMY FUSION N/A 08/05/2018   Procedure: ANTERIOR CERVICAL DECOMPRESSION/DISCECTOMY FUSION 2 LEVELS-C4-5, C5-6;  Surgeon: Bluford Standing, MD;  Location: [facility] ORS;  Service: Neurosurgery;   Laterality: N/A;   CARDIAC CATHETERIZATION Left 09/07/2016   Procedure: Left Heart Cath and Coronary Angiography;  Surgeon: Cara BIRCH Lovelace, MD;  Location: [facility] INVASIVE CV LAB;  Service: Cardiovascular;  Laterality: Left;   JOINT REPLACEMENT     MOUTH SURGERY     teeth pulled. impacted molars   TOTAL HIP ARTHROPLASTY Left 07/01/2019   Procedure: LEFT TOTAL HIP ARTHROPLASTY;  Surgeon: Kathlynn Sharper, MD;  Location: [facility] ORS;  Service: Orthopedics;  Laterality: Left;   TOTAL HIP ARTHROPLASTY Right 08/31/2020   Procedure: TOTAL HIP ARTHROPLASTY ANTERIOR APPROACH;  Surgeon: Kathlynn Sharper, MD;  Location: [facility] ORS;  Service: Orthopedics;  Laterality: Right;     reports that he quit smoking about 16 years ago. His smoking use included cigarettes. He started smoking about 24 years ago. He has a 2 pack-year smoking history. He has never used smokeless tobacco. He reports current alcohol use. He reports that he does not use drugs.  No Known Allergies  Family History  Problem Relation Age of Onset   Diabetes Mother    Diabetes Father     Prior to Admission medications   Medication Sig Start Date End Date Taking? Authorizing Provider  gabapentin  (NEURONTIN ) 300 MG capsule Take 300 mg by mouth at bedtime. 08/25/24  Yes [provider]  metFORMIN (GLUCOPHAGE) 500 MG tablet Take 500 mg by mouth 2 (two) times daily. 08/14/24  Yes [provider]  ondansetron  (ZOFRAN -ODT) 4 MG disintegrating tablet Take 4  mg by mouth 3 (three) times daily as needed. 08/14/24  Yes [provider]  ALPRAZolam  (XANAX ) 0.5 MG tablet Take 0.5 mg by mouth 3 (three) times daily as needed for anxiety.     [provider]  cetirizine (ZYRTEC) 10 MG tablet Take 10 mg by mouth daily as needed for allergies.    [provider]  escitalopram  (LEXAPRO ) 20 MG tablet Take 20 mg by mouth daily. 11/13/16   [provider]  metoprolol  tartrate (LOPRESSOR ) 25 MG tablet Take 25 mg by mouth 2  (two) times daily.    [provider]  Multiple Vitamin (MULTIVITAMIN WITH MINERALS) TABS tablet Take 1 tablet by mouth daily.    [provider]    Physical Exam: Vitals:   09/24/24 0830 09/24/24 0900 09/24/24 0919 09/24/24 0930  BP: 103/66 129/71  121/72  Pulse: 73 69  69  Resp: 13 13  (!) 22  Temp:   98 F (36.7 C)   TempSrc:   Oral   SpO2: 100% 100%  96%  Weight:      Height:        Constitutional: NAD, calm, comfortable Vitals:   09/24/24 0830 09/24/24 0900 09/24/24 0919 09/24/24 0930  BP: 103/66 129/71  121/72  Pulse: 73 69  69  Resp: 13 13  (!) 22  Temp:   98 F (36.7 C)   TempSrc:   Oral   SpO2: 100% 100%  96%  Weight:      Height:       Eyes: PERRL, lids and conjunctivae normal ENMT: Mucous membranes are moist. Posterior pharynx clear of any exudate or lesions.Normal dentition.  Neck: normal, supple, no masses, no thyromegaly Respiratory: clear to auscultation bilaterally, no wheezing, no crackles. Normal respiratory effort. No accessory muscle use.  Cardiovascular: Regular rate and rhythm, no murmurs / rubs / gallops. No extremity edema. 2+ pedal pulses. No carotid bruits.  Abdomen: no tenderness, no masses palpated. No hepatosplenomegaly. Bowel sounds positive.  Musculoskeletal: no clubbing / cyanosis. No joint deformity upper and lower extremities. Good ROM, no contractures. Normal muscle tone.  Skin: no rashes, lesions, ulcers. No induration Neurologic: CN 2-12 grossly intact. Sensation intact, DTR normal. Strength 5/5 in all 4.  Psychiatric: Normal judgment and insight. Alert and oriented x 3. Normal mood.     Labs on Admission: I have personally reviewed following labs and imaging studies  CBC: Recent Labs  Lab 09/24/24 0540  WBC 6.5  NEUTROABS 5.0  HGB 8.9*  HCT 27.7*  MCV 93.3  PLT 106*   Basic Metabolic Panel: Recent Labs  Lab 09/24/24 0540 09/24/24 0541  NA 133*  --   K 3.0*  --   CL 92*  --   CO2 24  --   GLUCOSE  148*  --   BUN 10  --   CREATININE 0.87  --   CALCIUM  9.5  --   MG  --  1.2*   GFR: Estimated Creatinine Clearance: 115.8 mL/min (by C-G formula based on SCr of 0.87 mg/dL). Liver Function Tests: Recent Labs  Lab 09/24/24 0540  AST 41  ALT 32  ALKPHOS 52  BILITOT 0.8  PROT 7.5  ALBUMIN 3.8   No results for input(s): LIPASE, AMYLASE in the last 168 hours. No results for input(s): AMMONIA in the last 168 hours. Coagulation Profile: No results for input(s): INR, PROTIME in the last 168 hours. Cardiac Enzymes: No results for input(s): CKTOTAL, CKMB, CKMBINDEX, TROPONINI in the last 168  hours. BNP (last 3 results) No results for input(s): PROBNP in the last 8760 hours. HbA1C: No results for input(s): HGBA1C in the last 72 hours. CBG: Recent Labs  Lab 09/24/24 0531  GLUCAP 147*   Lipid Profile: No results for input(s): CHOL, HDL, LDLCALC, TRIG, CHOLHDL, LDLDIRECT in the last 72 hours. Thyroid Function Tests: Recent Labs    09/24/24 0541  TSH 4.332   Anemia Panel: No results for input(s): VITAMINB12, FOLATE, FERRITIN, TIBC, IRON , RETICCTPCT in the last 72 hours. Urine analysis:    Component Value Date/Time   COLORURINE YELLOW (A) 09/24/2024 0621   APPEARANCEUR CLEAR (A) 09/24/2024 0621   LABSPEC 1.004 (L) 09/24/2024 0621   PHURINE 6.0 09/24/2024 0621   GLUCOSEU NEGATIVE 09/24/2024 0621   HGBUR NEGATIVE 09/24/2024 0621   BILIRUBINUR NEGATIVE 09/24/2024 0621   KETONESUR NEGATIVE 09/24/2024 0621   PROTEINUR NEGATIVE 09/24/2024 0621   NITRITE NEGATIVE 09/24/2024 0621   LEUKOCYTESUR NEGATIVE 09/24/2024 9378    Radiological Exams on Admission: CT Cervical Spine Wo Contrast Result Date: 09/24/2024 EXAM: CT CERVICAL SPINE WITHOUT CONTRAST 09/24/2024 06:00:24 AM TECHNIQUE: CT of the cervical spine was performed without the administration of intravenous contrast. Multiplanar reformatted images are provided for review.  Automated exposure control, iterative reconstruction, and/or weight based adjustment of the mA/kV was utilized to reduce the radiation dose to as low as reasonably achievable. COMPARISON: None available. CLINICAL HISTORY: Headache, neck pain, fall, intoxication. Pt fell out of bed, about a 2 in laceration noted to head. Pt complains of lightheaded. Pt denies LOC, not on blood thinners. FINDINGS: CERVICAL SPINE: BONES AND ALIGNMENT: There is straightening of the normal cervical lordosis. The patient is status post ACAF at C4-C5 and C5-C6. There is no evidence of acute traumatic injury. DEGENERATIVE CHANGES: There is uncovertebral joint hypertrophy present on the left at C5-C6, causing moderate left neural foraminal stenosis. There is chronic degenerative disc disease at C6-C7 with mild central spinal canal stenosis and bilateral neural foraminal stenosis. SOFT TISSUES: No prevertebral soft tissue swelling. There are calcifications within the carotid bulbs bilaterally. IMPRESSION: 1. No acute traumatic injury. 2. Status post ACDF at C4-5 and C5-6. 3. Uncovertebral joint hypertrophy at C5-6 causing moderate left neural foraminal stenosis. 4. Chronic degenerative disc disease at C6-7 with mild central spinal canal stenosis and bilateral neural foraminal stenosis. 5. Calcified atherosclerosis of the carotid bulbs bilaterally. Consider correlation with cardiovascular risk factors and outpatient carotid ultrasound if clinically indicated. Electronically signed by: Evalene Coho MD 09/24/2024 06:06 AM EDT RP Workstation: [id number]   CT HEAD WO CONTRAST ( ) Result Date: 09/24/2024 EXAM: CT HEAD WITHOUT CONTRAST 09/24/2024 06:00:24 AM TECHNIQUE: CT of the head was performed without the administration of intravenous contrast. Automated exposure control, iterative reconstruction, and/or weight based adjustment of the mA/kV was utilized to reduce the radiation dose to as low as reasonably achievable. COMPARISON: CT of  the head dated 12/07/2005. CLINICAL HISTORY: Headache, neck pain, fall, intoxication. Pt fell out of bed, about a 2 in laceration noted to head. Pt complains of lightheaded. Pt denies LOC, not on blood thinners. FINDINGS: BRAIN AND VENTRICLES: No acute hemorrhage. No evidence of acute infarct. No hydrocephalus. No extra-axial collection. No mass effect or midline shift. ORBITS: No acute abnormality. SINUSES: Mild mucosal thickening within left maxillary sinus. SOFT TISSUES AND SKULL: No acute soft tissue abnormality. No skull fracture. IMPRESSION: 1. No acute intracranial abnormality. 2. Mild mucosal thickening within the left maxillary sinus. Electronically signed by: Evalene Coho MD 09/24/2024 06:03 AM EDT RP  Workstation: [id number]    EKG: Independently reviewed.  Sinus rhythm, no acute ST changes.  Assessment/Plan Principal Problem:   Near syncope Active Problems:   Syncope, near  (please populate well all problems here in Problem List. (For example, if patient is on BP meds at home and you resume or decide to hold them, it is a problem that needs to be her. Same for CAD, COPD, HLD and so on)  Near syncope Symptomatic hypotension - Clinically suspect polypharmacy from interaction of of benzo and alcohol caused unsafe BP drop.  As UDS showed positive benzos. - Plan to hold off home BP meds for now - Encourage increased IV hydration - Other DDx, will keep patient on telemonitoring x 24 hours to rule out any significant ventricular arrhythmia, will also check echocardiogram.  Check orthostatic Vital Signs  HTN - Blood pressure borderline low normal, hold off metoprolol  for now  Alcohol abuse - Currently there is no symptoms or signs of acute alcohol withdrawal, will start patient on CIWA protocol with as needed benzos.  IIDM - No symptoms or signs of hypoglycemia - Check A1c - Continue metformin  DVT prophylaxis: Lovenox  Code Status: Full code Family Communication: Wife at  bedside Disposition Plan: Expect less than 2 midnight hospital stay Consults called: None Admission status: Telemetry observation   Cort ONEIDA Mana MD [facility] Pager [phone]  09/24/2024, 10:24 AM

## 2024-09-24 NOTE — ED Notes (Signed)
 Patient transported to CT

## 2024-09-24 NOTE — ED Notes (Signed)
 High risk falls bundle in place.

## 2024-09-25 LAB — COMPREHENSIVE METABOLIC PANEL WITH GFR
ALT: 30 U/L (ref 0–44)
AST: 34 U/L (ref 15–41)
Albumin: 3.9 g/dL (ref 3.5–5.0)
Alkaline Phosphatase: 57 U/L (ref 38–126)
Anion gap: 10 (ref 5–15)
BUN: 9 mg/dL (ref 6–20)
CO2: 27 mmol/L (ref 22–32)
Calcium: 9.2 mg/dL (ref 8.9–10.3)
Chloride: 102 mmol/L (ref 98–111)
Creatinine, Ser: 0.98 mg/dL (ref 0.61–1.24)
GFR, Estimated: >60 mL/min (ref >60)
Glucose, Bld: 96 mg/dL (ref 70–99)
Potassium: 4 mmol/L (ref 3.5–5.1)
Sodium: 139 mmol/L (ref 135–145)
Total Bilirubin: 0.8 mg/dL (ref 0.0–1.2)
Total Protein: 7.2 g/dL (ref 6.5–8.1)

## 2024-09-25 LAB — GLUCOSE, CAPILLARY
Glucose-Capillary: 111 mg/dL — ABNORMAL HIGH (ref 70–99)
Glucose-Capillary: 97 mg/dL (ref 70–99)
Glucose-Capillary: 92 mg/dL (ref 70–99)

## 2024-09-25 MED ORDER — METOPROLOL TARTRATE 25 MG PO TABS
12.5000 mg | ORAL_TABLET | Freq: Two times a day (BID) | ORAL | Status: DC
  Administered 2024-09-25: 12.5 mg via ORAL
  Filled 2024-09-25: qty 1

## 2024-09-25 MED ORDER — ACETAMINOPHEN 325 MG PO TABS: 650.0000 mg | ORAL_TABLET | Freq: Four times a day (QID) | ORAL | Status: DC | PRN

## 2024-09-25 MED ORDER — IBUPROFEN 800 MG PO TABS: 800.0000 mg | ORAL_TABLET | Freq: Three times a day (TID) | ORAL | 0 refills | Status: AC | PRN

## 2024-09-25 MED ORDER — GUAIFENESIN-DM 100-10 MG/5ML PO SYRP
5.0000 mL | ORAL_SOLUTION | ORAL | Status: DC | PRN
  Administered 2024-09-25: 5 mL via ORAL
  Filled 2024-09-25: qty 10

## 2024-09-25 NOTE — Hospital Course (Addendum)
 Hospital course / significant events:   Dennis Davidson is a 48 y.o. male with medical history significant of alcohol abuse, HTN, HLD, CAD, presented with near syncope and fall.   HPI: Patient came back home yesterday evening 10/28 from his job and he drank 4 beers and went to bed, this morning, he rolled out of bed, and hit my head he denied any headache, no loss control of urine or bowel movement, no LOC.  He did have some feeling of lightheadedness and nausea during the episode but denied any vomiting.  Denied any abdominal pain no diarrhea.  EMS found blood pressure 106/65 glucose 201.   10/29: to ED,  Afebrile, blood pressure on the lower side 105/59 O2/and 100% on room air.  Blood work showed K3.0 BUN 10 creatinine 0.8 hemoglobin 8.9 compared to baseline 7.7-10.0, WBC 6.5.  UA negative for UTI.  CT head and neck negative for acute intracranial findings or fracture or dislocation. Admitted to hospitalist. Hold home metoprolol   10/30: BP improved, resumed metoprolol . Telemetry ok, Echo ok, neg orthostatics. OK for dc home    Consultants:  none  Procedures/Surgeries: none      ASSESSMENT & PLAN:   Near syncope Symptomatic hypotension suspect polypharmacy from interaction of of benzo and alcohol + home antihypertensives caused unsafe BP drop.   Initially held antihypertensive (home metoprolol  25 mg bid), resume today given elevated BP this morning  Encourage increased po hydration Telemetry --> no concerns  Orthostatic VS --> neg Echo --> no concerns    Essential HTN Initially held antihypertensive (home metoprolol  25 mg bid), resume today   Alcohol abuse Currently there is no symptoms or signs of acute alcohol withdrawal,  Follow outpatient  Counseled on no benzos + EtOH    IIDM Dc insulin and resume home meds       Class 1 obesity based on BMI: Body mass index is 30.66 kg/m.SABRA Significantly low or high BMI is associated with higher medical risk.  Underweight -  under 18  overweight - 25 to 29 obese - 30 or more Class 1 obesity: BMI of 30.0 to 34 Class 2 obesity: BMI of 35.0 to 39 Class 3 obesity: BMI of 40.0 to 49 Super Morbid Obesity: BMI 50-59 Super-super Morbid Obesity: BMI 60+ Healthy nutrition and physical activity advised as adjunct to other disease management and risk reduction treatments

## 2024-09-25 NOTE — Discharge Summary (Signed)
 Physician Discharge Summary   Patient: JEROME VIGLIONE MRN: [id number]  DOB: Feb 21, 1976   Admit:     Date of Admission: 09/24/2024 Admitted from: home   Discharge: Date of discharge: 09/25/24 Disposition: Home Condition at discharge: good\  CODE STATUS: FULL CODE     Discharge Physician: Laneta Blunt, DO [facility]     PCP: Auston Reyes BIRCH, MD  Recommendations for Outpatient Follow-up:  Follow up with PCP Auston Reyes BIRCH, MD - has apt today   Discharge Instructions     Diet general   Complete by: As directed    Increase activity slowly   Complete by: As directed          Discharge Diagnoses: Principal Problem:   Near syncope Active Problems:   Syncope, near     Hospital course / significant events:   KENZO OZMENT is a 48 y.o. male with medical history significant of alcohol abuse, HTN, HLD, CAD, presented with near syncope and fall.   HPI: Patient came back home yesterday evening 10/28 from his job and he drank 4 beers and went to bed, this morning, he rolled out of bed, and hit my head he denied any headache, no loss control of urine or bowel movement, no LOC.  He did have some feeling of lightheadedness and nausea during the episode but denied any vomiting.  Denied any abdominal pain no diarrhea.  EMS found blood pressure 106/65 glucose 201.   10/29: to ED,  Afebrile, blood pressure on the lower side 105/59 O2/and 100% on room air.  Blood work showed K3.0 BUN 10 creatinine 0.8 hemoglobin 8.9 compared to baseline 7.7-10.0, WBC 6.5.  UA negative for UTI.  CT head and neck negative for acute intracranial findings or fracture or dislocation. Admitted to hospitalist. Hold home metoprolol   10/30: BP improved, resumed metoprolol . Telemetry ok, Echo ok, neg orthostatics. OK for dc home    Consultants:  none  Procedures/Surgeries: none      ASSESSMENT & PLAN:   Near syncope Symptomatic hypotension suspect polypharmacy from  interaction of of benzo and alcohol + home antihypertensives caused unsafe BP drop.   Initially held antihypertensive (home metoprolol  25 mg bid), resume today given elevated BP this morning  Encourage increased po hydration Telemetry --> no concerns  Orthostatic VS --> neg Echo --> no concerns    Head trauma w/ laceration Suspect likely has concsussion  Follow w/ PCP for staple removal approx 11/05-11/07  Essential HTN Initially held antihypertensive (home metoprolol  25 mg bid), resume today   Alcohol abuse Currently there is no symptoms or signs of acute alcohol withdrawal,  Follow outpatient  Counseled on no benzos + EtOH    IIDM Dc insulin and resume home meds       Class 1 obesity based on BMI: Body mass index is 30.66 kg/m.SABRA Significantly low or high BMI is associated with higher medical risk.  Underweight - under 18  overweight - 25 to 29 obese - 30 or more Class 1 obesity: BMI of 30.0 to 34 Class 2 obesity: BMI of 35.0 to 39 Class 3 obesity: BMI of 40.0 to 49 Super Morbid Obesity: BMI 50-59 Super-super Morbid Obesity: BMI 60+ Healthy nutrition and physical activity advised as adjunct to other disease management and risk reduction treatments           Discharge Instructions  Allergies as of 09/25/2024   No Known Allergies      Medication List  TAKE these medications    ALPRAZolam  0.5 MG tablet Commonly known as: XANAX  Take 0.5 mg by mouth 3 (three) times daily as needed for anxiety.   cetirizine 10 MG tablet Commonly known as: ZYRTEC Take 10 mg by mouth daily as needed for allergies.   diphenhydrAMINE  25 mg capsule Commonly known as: BENADRYL  Take 25 mg by mouth every 6 (six) hours as needed for allergies.   escitalopram  20 MG tablet Commonly known as: LEXAPRO  Take 20 mg by mouth daily.   gabapentin  300 MG capsule Commonly known as: NEURONTIN  Take 300 mg by mouth at bedtime.   ibuprofen 800 MG tablet Commonly known as:  ADVIL Take 1 tablet (800 mg total) by mouth every 8 (eight) hours as needed for moderate pain (pain score 4-6), fever, headache or mild pain (pain score 1-3).   metFORMIN 500 MG tablet Commonly known as: GLUCOPHAGE Take 500 mg by mouth 2 (two) times daily.   metoprolol  tartrate 25 MG tablet Commonly known as: LOPRESSOR  Take 25 mg by mouth 2 (two) times daily.   multivitamin with minerals Tabs tablet Take 1 tablet by mouth daily.   ondansetron  4 MG disintegrating tablet Commonly known as: ZOFRAN -ODT Take 4 mg by mouth 3 (three) times daily as needed.          No Known Allergies   Subjective: pt reports feeling much better this morning, some persistent headache, no vision change or dizziness     Discharge Exam: BP (!) 163/91 (BP Location: Right Arm)   Pulse 88   Temp 98.4 F (36.9 C)   Resp 16   Ht 5' 7 (1.702 m)   Wt 88.8 kg   SpO2 100%   BMI 30.66 kg/m  General: Pt is alert, awake, not in acute distress Cardiovascular: RRR, S1/S2 +, no rubs, no gallops Respiratory: CTA bilaterally, no wheezing, no rhonchi Abdominal: Soft, NT, ND, bowel sounds + Extremities: no edema, no cyanosis     The results of significant diagnostics from this hospitalization (including imaging, microbiology, ancillary and laboratory) are listed below for reference.     Microbiology: No results found for this or any previous visit (from the past 240 hours).   Labs: BNP (last 3 results) No results for input(s): BNP in the last 8760 hours. Basic Metabolic Panel: Recent Labs  Lab 09/24/24 0540 09/24/24 0541 09/25/24 0419  NA 133*  --  139  K 3.0*  --  4.0  CL 92*  --  102  CO2 24  --  27  GLUCOSE 148*  --  96  BUN 10  --  9  CREATININE 0.87  --  0.98  CALCIUM  9.5  --  9.2  MG  --  1.2*  --    Liver Function Tests: Recent Labs  Lab 09/24/24 0540 09/25/24 0419  AST 41 34  ALT 32 30  ALKPHOS 52 57  BILITOT 0.8 0.8  PROT 7.5 7.2  ALBUMIN 3.8 3.9   No results for  input(s): LIPASE, AMYLASE in the last 168 hours. No results for input(s): AMMONIA in the last 168 hours. CBC: Recent Labs  Lab 09/24/24 0540  WBC 6.5  NEUTROABS 5.0  HGB 8.9*  HCT 27.7*  MCV 93.3  PLT 106*   Cardiac Enzymes: No results for input(s): CKTOTAL, CKMB, CKMBINDEX, TROPONINI in the last 168 hours. BNP: Invalid input(s): POCBNP CBG: Recent Labs  Lab 09/24/24 1714 09/24/24 2236 09/25/24 0504 09/25/24 0731 09/25/24 1141  GLUCAP 103* 94 111* 97 92  D-Dimer No results for input(s): DDIMER in the last 72 hours. Hgb A1c Recent Labs    09/24/24 0540  HGBA1C 6.9*   Lipid Profile No results for input(s): CHOL, HDL, LDLCALC, TRIG, CHOLHDL, LDLDIRECT in the last 72 hours. Thyroid function studies Recent Labs    09/24/24 0541  TSH 4.332   Anemia work up No results for input(s): VITAMINB12, FOLATE, FERRITIN, TIBC, IRON , RETICCTPCT in the last 72 hours. Urinalysis    Component Value Date/Time   COLORURINE YELLOW (A) 09/24/2024 0621   APPEARANCEUR CLEAR (A) 09/24/2024 0621   LABSPEC 1.004 (L) 09/24/2024 0621   PHURINE 6.0 09/24/2024 0621   GLUCOSEU NEGATIVE 09/24/2024 0621   HGBUR NEGATIVE 09/24/2024 0621   BILIRUBINUR NEGATIVE 09/24/2024 0621   KETONESUR NEGATIVE 09/24/2024 0621   PROTEINUR NEGATIVE 09/24/2024 0621   NITRITE NEGATIVE 09/24/2024 0621   LEUKOCYTESUR NEGATIVE 09/24/2024 0621   Sepsis Labs Recent Labs  Lab 09/24/24 0540  WBC 6.5   Microbiology No results found for this or any previous visit (from the past 240 hours). Imaging ECHOCARDIOGRAM COMPLETE Result Date: 09/24/2024    ECHOCARDIOGRAM REPORT   Patient Name:   NIRAV SWEDA Date of Exam: 09/24/2024 Medical Rec #:  [id number]      Height:       67.0 in Accession #:    [id number]     Weight:       207.0 lb Date of Birth:  [birth year]     BSA:          2.052 m Patient Age:    47 years       BP:           121/72 mmHg Patient Gender: M               HR:           70 bpm. Exam Location:  [facility] Procedure: 2D Echo, Color Doppler, Cardiac Doppler and Intracardiac            Opacification Agent (Both Spectral and Color Flow Doppler were            utilized during procedure). Indications:     Syncope R55  History:         Patient has no prior history of Echocardiogram examinations.                  Signs/Symptoms:Syncope.  Sonographer:     Rosina Dunk Referring Phys:  [id number] CORT ONEIDA MANA Diagnosing Phys: Evalene Lunger MD IMPRESSIONS  1. Left ventricular ejection fraction, by estimation, is 60 to 65%. The left ventricle has normal function. The left ventricle has no regional wall motion abnormalities. Left ventricular diastolic parameters were normal.  2. Right ventricular systolic function is normal. The right ventricular size is mildly enlarged. There is normal pulmonary artery systolic pressure. The estimated right ventricular systolic pressure is 18.4 mmHg.  3. The mitral valve is normal in structure. Mild mitral valve regurgitation. No evidence of mitral stenosis.  4. The aortic valve is normal in structure. There is mild calcification of the aortic valve. Aortic valve regurgitation is not visualized. Aortic valve sclerosis/calcification is present, without any evidence of aortic stenosis.  5. The inferior vena cava is normal in size with greater than 50% respiratory variability, suggesting right atrial pressure of 3 mmHg. FINDINGS  Left Ventricle: Left ventricular ejection fraction, by estimation, is 60 to 65%. The left ventricle has normal function. The left ventricle has no regional wall motion abnormalities. Definity  contrast agent was given IV to delineate the left ventricular  endocardial borders. Strain was performed and the global longitudinal strain is indeterminate. The left ventricular internal cavity size was normal in size. There is no left ventricular hypertrophy. Left ventricular diastolic parameters were normal. Right Ventricle: The  right ventricular size is mildly enlarged. No increase in right ventricular wall thickness. Right ventricular systolic function is normal. There is normal pulmonary artery systolic pressure. The tricuspid regurgitant velocity is 1.83  m/s, and with an assumed right atrial pressure of 5 mmHg, the estimated right ventricular systolic pressure is 18.4 mmHg. Left Atrium: Left atrial size was normal in size. Right Atrium: Right atrial size was normal in size. Pericardium: There is no evidence of pericardial effusion. Mitral Valve: The mitral valve is normal in structure. Mild mitral valve regurgitation. No evidence of mitral valve stenosis. MV peak gradient, 6.6 mmHg. The mean mitral valve gradient is 3.0 mmHg. Tricuspid Valve: The tricuspid valve is normal in structure. Tricuspid valve regurgitation is mild . No evidence of tricuspid stenosis. Aortic Valve: The aortic valve is normal in structure. There is mild calcification of the aortic valve. Aortic valve regurgitation is not visualized. Aortic valve sclerosis/calcification is present, without any evidence of aortic stenosis. Aortic valve mean gradient measures 7.0 mmHg. Aortic valve peak gradient measures 12.2 mmHg. Aortic valve area, by VTI measures 1.68 cm. Pulmonic Valve: The pulmonic valve was normal in structure. Pulmonic valve regurgitation is not visualized. No evidence of pulmonic stenosis. Aorta: The aortic root is normal in size and structure. Venous: The inferior vena cava is normal in size with greater than 50% respiratory variability, suggesting right atrial pressure of 3 mmHg. IAS/Shunts: No atrial level shunt detected by color flow Doppler. Additional Comments: 3D was performed not requiring image post processing on an independent workstation and was indeterminate.  LEFT VENTRICLE PLAX 2D LVIDd:         5.00 cm     Diastology LVIDs:         1.80 cm     LV e' medial:    7.72 cm/s LV PW:         1.00 cm     LV E/e' medial:  14.6 LV IVS:        1.00 cm      LV e' lateral:   8.81 cm/s LVOT diam:     1.80 cm     LV E/e' lateral: 12.8 LV SV:         61 LV SV Index:   30 LVOT Area:     2.54 cm  LV Volumes (MOD) LV vol d, MOD A2C: 56.7 ml LV vol d, MOD A4C: 61.6 ml LV vol s, MOD A2C: 8.5 ml LV vol s, MOD A4C: 14.1 ml LV SV MOD A2C:     48.2 ml LV SV MOD A4C:     61.6 ml LV SV MOD BP:      48.7 ml RIGHT VENTRICLE             IVC RV Basal diam:  4.70 cm     IVC diam: 2.70 cm RV Mid diam:    3.70 cm RV S prime:     12.70 cm/s TAPSE (M-mode): 2.7 cm LEFT ATRIUM             Index        RIGHT ATRIUM           Index LA diam:  4.30 cm 2.10 cm/m   RA Area:     24.20 cm LA Vol (A2C):   42.9 ml 20.91 ml/m  RA Volume:   81.30 ml  39.62 ml/m LA Vol (A4C):   57.1 ml 27.83 ml/m LA Biplane Vol: 50.1 ml 24.41 ml/m  AORTIC VALVE                     PULMONIC VALVE AV Area (Vmax):    1.60 cm      PV Vmax:        1.04 m/s AV Area (Vmean):   1.60 cm      PV Vmean:       72.400 cm/s AV Area (VTI):     1.68 cm      PV VTI:         0.216 m AV Vmax:           175.00 cm/s   PV Peak grad:   4.3 mmHg AV Vmean:          123.000 cm/s  PV Mean grad:   2.0 mmHg AV VTI:            0.361 m       RVOT Peak grad: 1 mmHg AV Peak Grad:      12.2 mmHg AV Mean Grad:      7.0 mmHg LVOT Vmax:         110.00 cm/s LVOT Vmean:        77.100 cm/s LVOT VTI:          0.238 m LVOT/AV VTI ratio: 0.66  AORTA Ao Root diam: 3.60 cm Ao Asc diam:  2.70 cm MITRAL VALVE                TRICUSPID VALVE MV Area (PHT): 3.77 cm     TR Peak grad:   13.4 mmHg MV Area VTI:   1.79 cm     TR Mean grad:   9.0 mmHg MV Peak grad:  6.6 mmHg     TR Vmax:        183.00 cm/s MV Mean grad:  3.0 mmHg     TR Vmean:       146.0 cm/s MV Vmax:       1.28 m/s MV Vmean:      75.5 cm/s    SHUNTS MV Decel Time: 201 msec     Systemic VTI:  0.24 m MV E velocity: 113.00 cm/s  Systemic Diam: 1.80 cm MV A velocity: 87.80 cm/s   Pulmonic VTI:  0.133 m MV E/A ratio:  1.29 Evalene Lunger MD Electronically signed by Evalene Lunger MD Signature  Date/Time: 09/24/2024/4:27:14 PM    Final    CT Cervical Spine Wo Contrast Result Date: 09/24/2024 EXAM: CT CERVICAL SPINE WITHOUT CONTRAST 09/24/2024 06:00:24 AM TECHNIQUE: CT of the cervical spine was performed without the administration of intravenous contrast. Multiplanar reformatted images are provided for review. Automated exposure control, iterative reconstruction, and/or weight based adjustment of the mA/kV was utilized to reduce the radiation dose to as low as reasonably achievable. COMPARISON: None available. CLINICAL HISTORY: Headache, neck pain, fall, intoxication. Pt fell out of bed, about a 2 in laceration noted to head. Pt complains of lightheaded. Pt denies LOC, not on blood thinners. FINDINGS: CERVICAL SPINE: BONES AND ALIGNMENT: There is straightening of the normal cervical lordosis. The patient is status post ACAF at C4-C5 and C5-C6. There is no evidence of acute traumatic injury. DEGENERATIVE CHANGES: There  is uncovertebral joint hypertrophy present on the left at C5-C6, causing moderate left neural foraminal stenosis. There is chronic degenerative disc disease at C6-C7 with mild central spinal canal stenosis and bilateral neural foraminal stenosis. SOFT TISSUES: No prevertebral soft tissue swelling. There are calcifications within the carotid bulbs bilaterally. IMPRESSION: 1. No acute traumatic injury. 2. Status post ACDF at C4-5 and C5-6. 3. Uncovertebral joint hypertrophy at C5-6 causing moderate left neural foraminal stenosis. 4. Chronic degenerative disc disease at C6-7 with mild central spinal canal stenosis and bilateral neural foraminal stenosis. 5. Calcified atherosclerosis of the carotid bulbs bilaterally. Consider correlation with cardiovascular risk factors and outpatient carotid ultrasound if clinically indicated. Electronically signed by: Evalene Coho MD 09/24/2024 06:06 AM EDT RP Workstation: [id number]   CT HEAD WO CONTRAST ( ) Result Date: 09/24/2024 EXAM: CT HEAD  WITHOUT CONTRAST 09/24/2024 06:00:24 AM TECHNIQUE: CT of the head was performed without the administration of intravenous contrast. Automated exposure control, iterative reconstruction, and/or weight based adjustment of the mA/kV was utilized to reduce the radiation dose to as low as reasonably achievable. COMPARISON: CT of the head dated 12/07/2005. CLINICAL HISTORY: Headache, neck pain, fall, intoxication. Pt fell out of bed, about a 2 in laceration noted to head. Pt complains of lightheaded. Pt denies LOC, not on blood thinners. FINDINGS: BRAIN AND VENTRICLES: No acute hemorrhage. No evidence of acute infarct. No hydrocephalus. No extra-axial collection. No mass effect or midline shift. ORBITS: No acute abnormality. SINUSES: Mild mucosal thickening within left maxillary sinus. SOFT TISSUES AND SKULL: No acute soft tissue abnormality. No skull fracture. IMPRESSION: 1. No acute intracranial abnormality. 2. Mild mucosal thickening within the left maxillary sinus. Electronically signed by: Evalene Coho MD 09/24/2024 06:03 AM EDT RP Workstation: [id number]      Time coordinating discharge: over 30 minutes  SIGNED:  Nyeema Want DO [facility]

## 2024-09-25 NOTE — Plan of Care (Signed)
  Problem: Coping: Goal: Ability to adjust to condition or change in health will improve Outcome: Progressing   Problem: Metabolic: Goal: Ability to maintain appropriate glucose levels will improve Outcome: Progressing   Problem: Education: Goal: Knowledge of General Education information will improve Description: Including pain rating scale, medication(s)/side effects and non-pharmacologic comfort measures Outcome: Progressing   Problem: Clinical Measurements: Goal: Diagnostic test results will improve Outcome: Progressing   Problem: Coping: Goal: Level of anxiety will decrease Outcome: Progressing
